# Patient Record
Sex: Male | Born: 1990 | Race: Black or African American | Hispanic: No | Marital: Single | Smoking: Current every day smoker
Health system: Southern US, Community
[De-identification: ages and names within clinical notes are randomized; demographics above are authoritative.]

---
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
## 2010-07-24 ENCOUNTER — Emergency Department: Payer: Self-pay | Admitting: Emergency Medicine

## 2012-01-06 ENCOUNTER — Emergency Department: Payer: Self-pay | Admitting: Emergency Medicine

## 2012-01-11 ENCOUNTER — Emergency Department: Payer: Self-pay | Admitting: Emergency Medicine

## 2012-07-24 ENCOUNTER — Emergency Department: Payer: Self-pay | Admitting: Emergency Medicine

## 2013-10-02 ENCOUNTER — Emergency Department (HOSPITAL_COMMUNITY)
Admission: EM | Admit: 2013-10-02 | Discharge: 2013-10-02 | Disposition: A | Discharge status: Home / Self Care | Payer: Commercial Managed Care - PPO | Attending: Emergency Medicine | Admitting: Emergency Medicine

## 2013-10-02 ENCOUNTER — Encounter (HOSPITAL_COMMUNITY): Payer: Self-pay | Admitting: Emergency Medicine

## 2013-10-02 ENCOUNTER — Emergency Department (HOSPITAL_COMMUNITY): Payer: Commercial Managed Care - PPO

## 2013-10-02 DIAGNOSIS — S8981XA Other specified injuries of right lower leg, initial encounter: Secondary | ICD-10-CM | POA: Insufficient documentation

## 2013-10-02 DIAGNOSIS — Y9367 Activity, basketball: Secondary | ICD-10-CM | POA: Diagnosis not present

## 2013-10-02 DIAGNOSIS — X58XXXA Exposure to other specified factors, initial encounter: Secondary | ICD-10-CM | POA: Insufficient documentation

## 2013-10-02 DIAGNOSIS — Z720 Tobacco use: Secondary | ICD-10-CM | POA: Insufficient documentation

## 2013-10-02 DIAGNOSIS — Y9289 Other specified places as the place of occurrence of the external cause: Secondary | ICD-10-CM | POA: Diagnosis not present

## 2013-10-02 DIAGNOSIS — M25561 Pain in right knee: Secondary | ICD-10-CM

## 2013-10-02 MED ORDER — NAPROXEN 500 MG PO TABS: 500.0000 mg | ORAL_TABLET | Freq: Two times a day (BID) | ORAL | Status: DC

## 2013-10-02 NOTE — ED Notes (Signed)
Patient transported to X-ray 

## 2013-10-02 NOTE — ED Provider Notes (Signed)
CSN: [id number]     Arrival date & time 10/02/13  0008 History   First MD Initiated Contact with Patient 10/02/13 0030     Chief Complaint  Patient presents with  . Knee Injury     (Consider location/radiation/quality/duration/timing/severity/associated sxs/prior Treatment) HPI Sean Clay is a 23 y.o. male with no significant past medical history who comes in for evaluation of right knee pain. Patient states this evening he was playing basketball and went up to jump and as he tried to jump he felt his right knee "give out". He says the bottom of his leg went one direction and the top of his leg went under the direction. He denies any player contact with any one else. Rest makes the pain better and palpation and certain movements exacerbate the pain. He is not taking any medications for the pain. He denies any fevers, numbness or weakness, nausea vomiting  History reviewed. No pertinent past medical history. History reviewed. No pertinent past surgical history. No family history on file. History  Substance Use Topics  . Smoking status: Current Every Day Smoker -- 0.50 packs/day    Types: Cigarettes  . Smokeless tobacco: Not on file  . Alcohol Use: Yes    Review of Systems  Musculoskeletal: Positive for arthralgias.  All other systems reviewed and are negative.     Allergies  Review of patient's allergies indicates no known allergies.  Home Medications   Prior to Admission medications   Not on File   BP 116/63  Pulse 79  Temp(Src) 99 F (37.2 C) (Oral)  Resp 20  SpO2 95% Physical Exam  Nursing note and vitals reviewed. Constitutional: He is oriented to person, place, and time. He appears well-developed and well-nourished.  Awake, alert, nontoxic appearance.  HENT:  Head: Normocephalic and atraumatic.  Mouth/Throat: Oropharynx is clear and moist.  Eyes: Conjunctivae are normal. Pupils are equal, round, and reactive to light. Right eye exhibits no discharge. Left eye  exhibits no discharge. No scleral icterus.  Neck: Neck supple. No JVD present.  Cardiovascular: Intact distal pulses.   Pulmonary/Chest: Effort normal. No respiratory distress.  Abdominal: Soft. He exhibits no distension.  Musculoskeletal:  Tenderness to palpation diffusely of the right knee. Active range of motion is limited due to discomfort. Patient has full passive range of motion. There does not appear to be any ligamentous laxity. No overt swelling or warmth. No erythema. No other obvious lesions or deformities appreciated. Full active range of motion of right hip and right ankle.  Neurological: He is alert and oriented to person, place, and time.  Cranial Nerves II-XII grossly intact. Motor and sensation intact and equal bilaterally. Neurovascularly intact  Skin: Skin is warm and dry. No rash noted.  Psychiatric: He has a normal mood and affect.    ED Course  Procedures (including critical care time) Labs Review Labs Reviewed - No data to display  Imaging Review Dg Knee Complete 4 Views Right  10/02/2013   CLINICAL DATA:  Acute injury: Twisted RIGHT knee while playing basketball. Unable to bear weight.  EXAM: RIGHT KNEE - COMPLETE 4+ VIEW  COMPARISON:  None.  FINDINGS: There is no evidence of fracture, dislocation, or joint effusion. There is no evidence of arthropathy or other focal bone abnormality. Moderate suprapatellar joint effusion.  IMPRESSION: Moderate suprapatellar joint effusion without fracture deformity or dislocation.   Electronically Signed   By: Awilda Metroourtnay  Bloomer   On: 10/02/2013 01:20     EKG Interpretation None  MDM  Vitals stable - WNL -afebrile Pt resting comfortably in ED. playing on his phone and watching TV in no apparent distress. PE not concerning for other acute or emergent pathology. No evidence of hemarthrosis or septic joint. No compartment syndrome. No vascular compromise. Knee pain most likely due to strain. Cannot rule out meniscal tear,  will have patient followup with orthopedics.  Imaging shows no evidence of fracture, dislocation or other osseous abnormalities.  Will DC with knee immobilizer at patient's request, naproxen for inflammation and pain management. We'll have patient followup with Dr. Roda Shutters for orthopedic referral for further evaluation and management of his right knee pain. Discussed f/u with PCP and return precautions, pt very amenable to plan. Patient stable, in good condition and is appropriate for discharge.   Final diagnoses:  Right knee pain        Sharlene Motts, PA-C 10/02/13 0159

## 2013-10-02 NOTE — ED Notes (Signed)
Patient here with complaint of right knee injury. States that he was playing basketball, jumped, and his knee gave out. States that his knee felt like it went in 2 different directions. Range of motion intact, mild swelling noted to lateral aspect of right knee, anterior medial aspect is tender to palpation. Patient states that standing and walking is very painful. Explains that he has had injuries to that knee before, but never able to get it checked until now.

## 2013-10-02 NOTE — ED Provider Notes (Signed)
Medical screening examination/treatment/procedure(s) were performed by non-physician practitioner and as supervising physician I was immediately available for consultation/collaboration.     Devonn Giampietro, MD 10/02/13 0547 

## 2013-10-02 NOTE — Discharge Instructions (Signed)

## 2013-10-07 ENCOUNTER — Other Ambulatory Visit: Payer: Self-pay | Admitting: Orthopedic Surgery

## 2013-10-07 DIAGNOSIS — M25561 Pain in right knee: Secondary | ICD-10-CM

## 2013-10-09 ENCOUNTER — Ambulatory Visit
Admission: RE | Admit: 2013-10-09 | Discharge: 2013-10-09 | Disposition: A | Discharge status: Home / Self Care | Payer: 59 | Source: Ambulatory Visit | Attending: Orthopedic Surgery | Admitting: Orthopedic Surgery

## 2013-10-09 DIAGNOSIS — M25561 Pain in right knee: Secondary | ICD-10-CM

## 2014-01-24 ENCOUNTER — Emergency Department: Payer: Self-pay | Admitting: Internal Medicine

## 2015-06-20 ENCOUNTER — Encounter: Payer: Self-pay | Admitting: Emergency Medicine

## 2015-06-20 ENCOUNTER — Emergency Department
Admission: EM | Admit: 2015-06-20 | Discharge: 2015-06-20 | Disposition: A | Discharge status: Home / Self Care | Payer: 59 | Attending: Emergency Medicine | Admitting: Emergency Medicine

## 2015-06-20 DIAGNOSIS — R05 Cough: Secondary | ICD-10-CM | POA: Diagnosis present

## 2015-06-20 DIAGNOSIS — F1290 Cannabis use, unspecified, uncomplicated: Secondary | ICD-10-CM | POA: Insufficient documentation

## 2015-06-20 DIAGNOSIS — J069 Acute upper respiratory infection, unspecified: Secondary | ICD-10-CM | POA: Diagnosis not present

## 2015-06-20 DIAGNOSIS — F17210 Nicotine dependence, cigarettes, uncomplicated: Secondary | ICD-10-CM | POA: Diagnosis not present

## 2015-06-20 DIAGNOSIS — B9789 Other viral agents as the cause of diseases classified elsewhere: Secondary | ICD-10-CM

## 2015-06-20 DIAGNOSIS — J988 Other specified respiratory disorders: Secondary | ICD-10-CM

## 2015-06-20 MED ORDER — PSEUDOEPH-BROMPHEN-DM 30-2-10 MG/5ML PO SYRP: 5.0000 mL | ORAL_SOLUTION | Freq: Three times a day (TID) | ORAL | Status: DC | PRN

## 2015-06-20 NOTE — Discharge Instructions (Signed)
Viral Infections °A viral infection can be caused by different types of viruses. Most viral infections are not serious and resolve on their own. However, some infections may cause severe symptoms and may lead to further complications. °SYMPTOMS °Viruses can frequently cause: °· Minor sore throat. °· Aches and pains. °· Headaches. °· Runny nose. °· Different types of rashes. °· Watery eyes. °· Tiredness. °· Cough. °· Loss of appetite. °· Gastrointestinal infections, resulting in nausea, vomiting, and diarrhea. °These symptoms do not respond to antibiotics because the infection is not caused by bacteria. However, you might catch a bacterial infection following the viral infection. This is sometimes called a "superinfection." Symptoms of such a bacterial infection may include: °· Worsening sore throat with pus and difficulty swallowing. °· Swollen neck glands. °· Chills and a high or persistent fever. °· Severe headache. °· Tenderness over the sinuses. °· Persistent overall ill feeling (malaise), muscle aches, and tiredness (fatigue). °· Persistent cough. °· Yellow, green, or brown mucus production with coughing. °HOME CARE INSTRUCTIONS  °· Only take over-the-counter or prescription medicines for pain, discomfort, diarrhea, or fever as directed by your caregiver. °· Drink enough water and fluids to keep your urine clear or pale yellow. Sports drinks can provide valuable electrolytes, sugars, and hydration. °· Get plenty of rest and maintain proper nutrition. Soups and broths with crackers or rice are fine. °SEEK IMMEDIATE MEDICAL CARE IF:  °· You have severe headaches, shortness of breath, chest pain, neck pain, or an unusual rash. °· You have uncontrolled vomiting, diarrhea, or you are unable to keep down fluids. °· You or your child has an oral temperature above 102° F (38.9° C), not controlled by medicine. °· Your baby is older than 3 months with a rectal temperature of 102° F (38.9° C) or higher. °· Your baby is 3  months old or younger with a rectal temperature of 100.4° F (38° C) or higher. °MAKE SURE YOU:  °· Understand these instructions. °· Will watch your condition. °· Will get help right away if you are not doing well or get worse. °  °This information is not intended to replace advice given to you by your health care provider. Make sure you discuss any questions you have with your health care provider. °  °Document Released: 09/27/2004 Document Revised: 03/12/2011 Document Reviewed: 05/26/2014 °Elsevier Interactive Patient Education ©2016 [employer] ° °

## 2015-06-20 NOTE — ED Notes (Signed)
Pt presents to ED with c/o cough and congestion since yesterday. Denies fever.

## 2015-06-20 NOTE — ED Provider Notes (Signed)
CSN: [id number]     Arrival date & time 06/20/15  1917 History   First MD Initiated Contact with Patient 06/20/15 2025     Chief Complaint  Patient presents with  . Cough  . Nasal Congestion     (Consider location/radiation/quality/duration/timing/severity/associated sxs/prior Treatment) HPI  25 year old male presents to emergency department for 2 day history of cough, chest and nasal congestion, sore throat, headache/sinus pressure. Symptoms been present for 2 days. No fevers. No nausea vomiting or diarrhea. Denies any chest pain shortness of breath or abdominal pain. Denies any skin rashes or neck pain. He uses nasal decongestant spray one day ago, and did see significant relief has not used today. Denies taking any other medications over-the-counter.  History reviewed. No pertinent past medical history. History reviewed. No pertinent past surgical history. No family history on file. Social History  Substance Use Topics  . Smoking status: Current Every Day Smoker -- 0.50 packs/day    Types: Cigarettes  . Smokeless tobacco: None  . Alcohol Use: Yes    Review of Systems  Constitutional: Positive for chills. Negative for fever, activity change and appetite change.  HENT: Positive for congestion. Negative for ear pain, mouth sores, rhinorrhea, sinus pressure, sore throat and trouble swallowing.   Eyes: Negative for photophobia, pain and discharge.  Respiratory: Positive for cough (dry nonproductive). Negative for chest tightness and shortness of breath.   Cardiovascular: Negative for chest pain and leg swelling.  Gastrointestinal: Negative for nausea, vomiting, abdominal pain, diarrhea and abdominal distention.  Genitourinary: Negative for dysuria and difficulty urinating.  Musculoskeletal: Negative for back pain, arthralgias and gait problem.  Skin: Negative for color change and rash.  Neurological: Negative for dizziness and headaches.  Hematological: Negative for adenopathy.   Psychiatric/Behavioral: Negative for behavioral problems and agitation.      Allergies  Review of patient's allergies indicates no known allergies.  Home Medications   Prior to Admission medications   Medication Sig Start Date End Date Taking? Authorizing Provider  brompheniramine-pseudoephedrine-DM 30-2-10 MG/5ML syrup Take 5 mLs by mouth 3 (three) times daily as needed. 06/20/15   Evon Slack, PA-C  naproxen (NAPROSYN) 500 MG tablet Take 1 tablet (500 mg total) by mouth 2 (two) times daily. 10/02/13   Joycie Peek, PA-C   BP 127/77 mmHg  Pulse 93  Temp(Src) 98.4 F (36.9 C) (Oral)  Resp 20  Ht 6' (1.829 m)  Wt 79.379 kg  BMI 23.73 kg/m2  SpO2 98% Physical Exam  Constitutional: He is oriented to person, place, and time. He appears well-developed and well-nourished.  HENT:  Head: Normocephalic and atraumatic.  Right Ear: External ear normal.  Left Ear: External ear normal.  Nose: Nose normal.  Mouth/Throat: Oropharynx is clear and moist. No oropharyngeal exudate.  No pharyngeal swelling, exudates, uvular shifting. No signs of peritonsillar abscess. Patient with mild nasal congestion. No tenderness to percussion over the frontal or maxillary sinuses.  Eyes: Conjunctivae and EOM are normal. Pupils are equal, round, and reactive to light.  Neck: Normal range of motion. Neck supple.  Cardiovascular: Normal rate, regular rhythm, normal heart sounds and intact distal pulses.   Pulmonary/Chest: Effort normal and breath sounds normal. No respiratory distress. He has no wheezes. He has no rales. He exhibits no tenderness.  Abdominal: Soft. Bowel sounds are normal. He exhibits no distension. There is no tenderness.  Musculoskeletal: Normal range of motion. He exhibits no edema or tenderness.  Lymphadenopathy:    He has cervical adenopathy (posterior cervical).  Neurological:  He is alert and oriented to person, place, and time.  Skin: Skin is warm and dry.  Psychiatric: He has  a normal mood and affect. His behavior is normal. Judgment and thought content normal.    ED Course  Procedures (including critical care time) Labs Review Labs Reviewed - No data to display  Imaging Review No results found. I have personally reviewed and evaluated these images and lab results as part of my medical decision-making.   EKG Interpretation None      MDM   Final diagnoses:  Viral respiratory infection  25 year old male with viral upper respiratory symptoms for 2 days. Vital signs are normal, exam unremarkable except for mild rhinorrhea, posterior cervical lymphadenopathy. Patient is started Bromfed-DM. He will also take ibuprofen over-the-counter as needed. Educated on signs and symptoms to return to the ED for.    Evon Slackhomas C Gaines, PA-C 06/20/15 2046  Maurilio LovelyNoelle McLaurin, MD 06/20/15 [phone]

## 2015-06-20 NOTE — ED Notes (Signed)
Pt c/o coughing, chest/nasal congestion with nasal drainage and HA x2 days. Pt denies any OTC meds.

## 2015-11-18 ENCOUNTER — Emergency Department
Admission: EM | Admit: 2015-11-18 | Discharge: 2015-11-18 | Disposition: A | Discharge status: Home / Self Care | Payer: Self-pay | Attending: Emergency Medicine | Admitting: Emergency Medicine

## 2015-11-18 ENCOUNTER — Encounter: Payer: Self-pay | Admitting: Emergency Medicine

## 2015-11-18 ENCOUNTER — Emergency Department: Payer: Self-pay

## 2015-11-18 DIAGNOSIS — A549 Gonococcal infection, unspecified: Secondary | ICD-10-CM | POA: Insufficient documentation

## 2015-11-18 DIAGNOSIS — F17210 Nicotine dependence, cigarettes, uncomplicated: Secondary | ICD-10-CM | POA: Insufficient documentation

## 2015-11-18 LAB — URINALYSIS COMPLETE WITH MICROSCOPIC (ARMC ONLY)
BILIRUBIN URINE: NEGATIVE
Bacteria, UA: NONE SEEN
GLUCOSE, UA: NEGATIVE mg/dL
KETONES UR: NEGATIVE mg/dL
NITRITE: NEGATIVE
Protein, ur: 30 mg/dL — AB
SPECIFIC GRAVITY, URINE: 1.02 (ref 1.005–1.030)
Squamous Epithelial / LPF: NONE SEEN
pH: 7 (ref 5.0–8.0)

## 2015-11-18 LAB — CHLAMYDIA/NGC RT PCR (ARMC ONLY)
CHLAMYDIA TR: NOT DETECTED
N GONORRHOEAE: DETECTED — AB

## 2015-11-18 MED ORDER — CEFTRIAXONE SODIUM 250 MG IJ SOLR
INTRAMUSCULAR | Status: AC
  Administered 2015-11-18: 250 mg via INTRAMUSCULAR
  Filled 2015-11-18: qty 250

## 2015-11-18 MED ORDER — CEFTRIAXONE SODIUM 250 MG IJ SOLR
250.0000 mg | Freq: Once | INTRAMUSCULAR | Status: AC
  Administered 2015-11-18: 250 mg via INTRAMUSCULAR
  Filled 2015-11-18: qty 250

## 2015-11-18 NOTE — ED Provider Notes (Signed)
  Physical Exam  BP (!) 141/79 (BP Location: Right Arm)   Pulse 97   Temp 98.4 F (36.9 C) (Oral)   Resp 18   Ht 6' (1.829 m)   Wt 76.7 kg   SpO2 98%   BMI 22.92 kg/m   Physical Exam  ED Course  Procedures  MDM ----------------------------------------- 4:37 PM on 11/18/2015 -----------------------------------------  Care was transferred to me by Durward Parcelon Smith, PA-C who completed initial assessment, physical exam. Gonorrhea and chlamydia culture has returned positive for gonorrhea. Order to treat the patient with 250 mg of IM Rocephin has been placed. Patient has been notified of all laboratory results and agrees to treatment with Rocephin. Currently we are awaiting CT results.  ----------------------------------------- 5:22 PM on 11/18/2015 -----------------------------------------  CT renal stone study returned without any acute abnormalities. Anticipate symptoms that patient was previously experiencing were due to the gonorrhea infection. He was treated with IM injection of Rocephin 250 mg tolerated well without side effects. Patient will [facility] with [facility] in one week for repeat testing. He is advised to abstain from all sexual intercourse until repeat testing for gonorrhea returns negative. He is also advised that all of his recent sexual partner should be evaluated and treated for STI in the outpatient urgent care for their choice or the [place] health Department. Patient verbalized understanding of all instructions.       Hope PigeonJami L Swati Granberry, PA-C 11/18/15 1724    Nita Sicklearolina Veronese, MD 11/18/15 2001

## 2015-11-18 NOTE — ED Notes (Signed)
Pt is in CT

## 2015-11-18 NOTE — ED Provider Notes (Signed)
[facility] Emergency Department Provider Note   ____________________________________________   First MD Initiated Contact with Patient 11/18/15 1411     (approximate)  I have reviewed the triage vital signs and the nursing notes.   HISTORY  Chief Complaint Knot in groin and Dysuria    HPI Sean Clay is a 25 y.o. male patient complaining of dysuria and a whitest discharge. Patient stated onset was right flank pain which began 3 days ago. Patient also complaining of knot in the right groin area. Patient stated onset of this complaint was yesterday. Patient denies any fever or chills. Patient state last unprotected sexual contact was a week ago. No palliative measures for this complaint.  History reviewed. No pertinent past medical history.  There are no active problems to display for this patient.   No past surgical history on file.  Prior to Admission medications   Medication Sig Start Date End Date Taking? Authorizing Provider  brompheniramine-pseudoephedrine-DM 30-2-10 MG/5ML syrup Take 5 mLs by mouth 3 (three) times daily as needed. 06/20/15   Evon Slackhomas C Gaines, PA-C  naproxen (NAPROSYN) 500 MG tablet Take 1 tablet (500 mg total) by mouth 2 (two) times daily. 10/02/13   Joycie PeekBenjamin Cartner, PA-C    Allergies Patient has no known allergies.  No family history on file.  Social History Social History  Substance Use Topics  . Smoking status: Current Every Day Smoker    Packs/day: 0.50    Types: Cigarettes  . Smokeless tobacco: Never Used  . Alcohol use Yes    Review of Systems Constitutional: No fever/chills Eyes: No visual changes. ENT: No sore throat. Cardiovascular: Denies chest pain. Respiratory: Denies shortness of breath. Gastrointestinal: No abdominal pain.  No nausea, no vomiting.  No diarrhea.  No constipation. Genitourinary: Positive for dysuria. Musculoskeletal: Negative for back pain. Skin: Negative for rash. Neurological:  Negative for headaches, focal weakness or numbness.    ____________________________________________   PHYSICAL EXAM:  VITAL SIGNS: ED Triage Vitals  Enc Vitals Group     BP 11/18/15 1358 (!) 141/79     Pulse Rate 11/18/15 1358 97     Resp 11/18/15 1358 18     Temp 11/18/15 1358 98.4 F (36.9 C)     Temp Source 11/18/15 1358 Oral     SpO2 11/18/15 1358 98 %     Weight 11/18/15 1359 169 lb (76.7 kg)     Height 11/18/15 1359 6' (1.829 m)     Head Circumference --      Peak Flow --      Pain Score --      Pain Loc --      Pain Edu? --      Excl. in GC? --     Constitutional: Alert and oriented. Well appearing and in no acute distress. Eyes: Conjunctivae are normal. PERRL. EOMI. Head: Atraumatic. Nose: No congestion/rhinnorhea. Mouth/Throat: Mucous membranes are moist.  Oropharynx non-erythematous. Neck: No stridor.  No cervical spine tenderness to palpation. Hematological/Lymphatic/Immunilogical:Right inguinal lymphadenopathy. Cardiovascular: Normal rate, regular rhythm. Grossly normal heart sounds.  Good peripheral circulation. Respiratory: Normal respiratory effort.  No retractions. Lungs CTAB. Gastrointestinal: Soft and nontender. No distention. No abdominal bruits. No CVA tenderness. Musculoskeletal: No lower extremity tenderness nor edema.  No joint effusions. Neurologic:  Normal speech and language. No gross focal neurologic deficits are appreciated. No gait instability. Skin:  Skin is warm, dry and intact. No rash noted. Psychiatric: Mood and affect are normal. Speech and behavior are normal.  ____________________________________________   LABS (all labs ordered are listed, but only abnormal results are displayed)  Labs Reviewed  CHLAMYDIA/NGC RT PCR ([facility] ONLY)  URINALYSIS COMPLETEWITH MICROSCOPIC ([facility] ONLY)    ____________________________________________  EKG   ____________________________________________  RADIOLOGY   ____________________________________________   PROCEDURES  Procedure(s) performed: None  Procedures  Critical Care performed: No  ____________________________________________   INITIAL IMPRESSION / ASSESSMENT AND PLAN / ED COURSE  Pertinent labs & imaging results that were available during my care of the patient were reviewed by me and considered in my medical decision making (see chart for details).   Care assumed by PA Hagler 1609 hrs.  Clinical Course      ____________________________________________   FINAL CLINICAL IMPRESSION(S) / ED DIAGNOSES  Final diagnoses:  None      NEW MEDICATIONS STARTED DURING THIS VISIT:  New Prescriptions   No medications on file     Note:  This document was prepared using Dragon voice recognition software and may include unintentional dictation errors.    Joni Reiningonald K Smith, PA-C 11/18/15 1610    Sharyn CreamerMark Quale, MD 11/20/15 [phone]

## 2015-11-18 NOTE — Discharge Instructions (Signed)
Abstain from all intercourse for 1 week and recheck with the health department.   All recent sexual partners should seek medical treatment in an outpatient urgent care or the health department to be tested and treated.

## 2015-11-18 NOTE — ED Notes (Signed)
See triage note  States he noticed a small knot to groin area this am  And some pain with urination  Also noted some white discharge in underwear  Denies any other sx's

## 2015-11-18 NOTE — ED Triage Notes (Signed)
Pt reports a knot felt in his groin, painful urination and dried white discharge in his underwear. Pt starts first noticed these things yesterday. Pt denies pain, denies nausea, vomiting or diarrhea.

## 2015-11-21 ENCOUNTER — Telehealth: Payer: Self-pay | Admitting: Emergency Medicine

## 2015-11-21 NOTE — Telephone Encounter (Signed)
Called patient to tell him he needs additional medication to treat the gonorrhea.  Per dr Cyril Loosenkinner can call in azithromycin 1 gram po.  Patient understands.  Says his current partner has been treated and former partner has been notified.  Azithromycin called in to [facility] [address].

## 2016-06-16 ENCOUNTER — Emergency Department
Admission: EM | Admit: 2016-06-16 | Discharge: 2016-06-16 | Disposition: A | Discharge status: Home / Self Care | Payer: 59 | Attending: Emergency Medicine | Admitting: Emergency Medicine

## 2016-06-16 ENCOUNTER — Encounter: Payer: Self-pay | Admitting: Emergency Medicine

## 2016-06-16 DIAGNOSIS — F17210 Nicotine dependence, cigarettes, uncomplicated: Secondary | ICD-10-CM | POA: Insufficient documentation

## 2016-06-16 DIAGNOSIS — R0981 Nasal congestion: Secondary | ICD-10-CM | POA: Insufficient documentation

## 2016-06-16 DIAGNOSIS — J00 Acute nasopharyngitis [common cold]: Secondary | ICD-10-CM | POA: Insufficient documentation

## 2016-06-16 LAB — POCT RAPID STREP A: Streptococcus, Group A Screen (Direct): NEGATIVE

## 2016-06-16 MED ORDER — AZITHROMYCIN 250 MG PO TABS: ORAL_TABLET | ORAL | 0 refills | Status: AC

## 2016-06-16 NOTE — ED Triage Notes (Signed)
Pt states that he has a sore throat which started this morning. Pt states that it has gotten progressively worse throughout the day. Pt is ambulatory to triage with NAD noted at this time.

## 2016-06-16 NOTE — ED Provider Notes (Signed)
[facility] Emergency Department Provider Note   ____________________________________________   I have reviewed the triage vital signs and the nursing notes.   HISTORY  Chief Complaint Sore Throat    HPI Sean Clay is a 26 y.o. male presents with sore throat, congestion and rhinorrhea that began 1 day ago. Patient denies any other symptoms, sick contacts or recent associated illnesses. Patient denies trying any OTC medications prior to coming to the Emergency department. Patient denies fever, chills, headache, vision changes, chest pain, chest tightness, shortness of breath, abdominal pain, nausea and vomiting.  History reviewed. No pertinent past medical history.  There are no active problems to display for this patient.   History reviewed. No pertinent surgical history.  Prior to Admission medications   Medication Sig Start Date End Date Taking? Authorizing Provider  azithromycin (ZITHROMAX Z-PAK) 250 MG tablet Take 2 tablets (500 mg) on  Day 1,  followed by 1 tablet (250 mg) once daily on Days 2 through 5. 06/16/16 06/21/16  Jamerion Cabello M, PA-C    Allergies Patient has no known allergies.  No family history on file.  Social History Social History  Substance Use Topics  . Smoking status: Current Every Day Smoker    Packs/day: 0.50    Types: Cigarettes  . Smokeless tobacco: Never Used  . Alcohol use Yes    Review of Systems Constitutional: Negative for fever/chills Eyes: No visual changes. ENT:  Positive for sore throat and congestion. Cardiovascular: Denies chest pain. Respiratory: Denies cough Denies shortness of breath. Gastrointestinal: No abdominal pain.  Musculoskeletal: Negative for back pain. Negative for generalized body aches. Skin: Negative for rash. Neurological: Negative for headaches.  ____________________________________________   PHYSICAL EXAM:  VITAL SIGNS: ED Triage Vitals [06/16/16 1904]  Enc Vitals Group     BP 131/72     Pulse Rate 79     Resp 18     Temp 98.2 F (36.8 C)     Temp Source Oral     SpO2 98 %     Weight 185 lb (83.9 kg)     Height 6\' 1"  (1.854 m)     Head Circumference      Peak Flow      Pain Score 6     Pain Loc      Pain Edu?      Excl. in GC?     Constitutional: Alert and oriented. Well appearing and in no acute distress.  Head: Normocephalic and atraumatic. Eyes: Conjunctivae are normal. PERRL. Ears: Canals clear. TMs intact bilaterally. Nose: Congestion and rhinorrhea present Mouth/Throat: Mucous membranes are moist. Oropharynx clear and normal. Tonsils bilaterally symmetrical.  Neck: Supple.  Hematological/Lymphatic/Immunological: No cervical lymphadenopathy. Cardiovascular: Normal rate, regular rhythm. Normal distal pulses. Respiratory: Normal respiratory effort. No wheezes/rales/rhonchi. Lungs CTAB. Gastrointestinal: Soft and nontender. Musculoskeletal: Nontender with normal range of motion in all extremities. Neurologic: Normal speech and language. Skin:  Skin is warm, dry and intact. No rash noted. Psychiatric: Mood and affect are normal.  ____________________________________________   LABS (all labs ordered are listed, but only abnormal results are displayed)  Labs Reviewed  POCT RAPID STREP A  Negative ____________________________________________  EKG none ____________________________________________  RADIOLOGY none ____________________________________________   PROCEDURES  Procedure(s) performed: no    Critical Care performed: no ____________________________________________   INITIAL IMPRESSION / ASSESSMENT AND PLAN / ED COURSE  Pertinent labs & imaging results that were available during my care of the patient were reviewed by me and considered in my  medical decision making (see chart for details).  Patient presents to the emergency department sore throat, congestion, rhinorrhea however afebrile. Symptoms are consistent with  nasopharyngitis. Patient will be given prescription for Azithromycin and advised to take over the counter cough and cold medication for symptoms relief. Patient will also be encourage to rest, and rehydrate as a part of supportive care. Physical exam is reassuring at this time. Patient / Family  informed of clinical course, understand medical decision-making process, and agree with plan.  Patient was advised to follow up with PCP as needed and was also advised to return to the emergency department for symptoms that change or worsen.      ____________________________________________   FINAL CLINICAL IMPRESSION(S) / ED DIAGNOSES  Final diagnoses:  Nasopharyngitis acute       NEW MEDICATIONS STARTED DURING THIS VISIT:  Discharge Medication List as of 06/16/2016  8:12 PM    START taking these medications   Details  azithromycin (ZITHROMAX Z-PAK) 250 MG tablet Take 2 tablets (500 mg) on  Day 1,  followed by 1 tablet (250 mg) once daily on Days 2 through 5., Print         Note:  This document was prepared using Dragon voice recognition software and may include unintentional dictation errors.   Cesare Sumlin, Jordan Likesraci M, PA-C 06/17/16 0000    Nita SickleVeronese, [place], MD 06/17/16 [phone]

## 2016-07-09 ENCOUNTER — Encounter: Payer: Self-pay | Admitting: *Deleted

## 2016-07-09 DIAGNOSIS — Z5321 Procedure and treatment not carried out due to patient leaving prior to being seen by health care provider: Secondary | ICD-10-CM | POA: Insufficient documentation

## 2016-07-09 DIAGNOSIS — R42 Dizziness and giddiness: Secondary | ICD-10-CM | POA: Insufficient documentation

## 2016-07-09 LAB — CBC
HCT: 46.3 % (ref 40.0–52.0)
Hemoglobin: 15.5 g/dL (ref 13.0–18.0)
MCH: 28.9 pg (ref 26.0–34.0)
MCHC: 33.6 g/dL (ref 32.0–36.0)
MCV: 86.2 fL (ref 80.0–100.0)
PLATELETS: 176 10*3/uL (ref 150–440)
RBC: 5.37 MIL/uL (ref 4.40–5.90)
RDW: 14.4 % (ref 11.5–14.5)
WBC: 9.3 10*3/uL (ref 3.8–10.6)

## 2016-07-09 LAB — BASIC METABOLIC PANEL
Anion gap: 6 (ref 5–15)
BUN: 13 mg/dL (ref 6–20)
CALCIUM: 9.5 mg/dL (ref 8.9–10.3)
CO2: 24 mmol/L (ref 22–32)
CREATININE: 1.1 mg/dL (ref 0.61–1.24)
Chloride: 107 mmol/L (ref 101–111)
GFR calc Af Amer: >60 mL/min (ref >60)
GFR calc non Af Amer: >60 mL/min (ref >60)
Glucose, Bld: 93 mg/dL (ref 65–99)
Potassium: 4.2 mmol/L (ref 3.5–5.1)
SODIUM: 137 mmol/L (ref 135–145)

## 2016-07-09 NOTE — ED Triage Notes (Signed)
Pt reports eating dinner at his moms house than feeling dizzy today, pt denies pain or any other symptoms

## 2016-07-10 ENCOUNTER — Emergency Department
Admission: EM | Admit: 2016-07-10 | Discharge: 2016-07-10 | Disposition: A | Discharge status: Home / Self Care | Payer: 59 | Attending: Emergency Medicine | Admitting: Emergency Medicine

## 2016-08-02 DIAGNOSIS — M25561 Pain in right knee: Secondary | ICD-10-CM | POA: Insufficient documentation

## 2016-08-02 DIAGNOSIS — F17210 Nicotine dependence, cigarettes, uncomplicated: Secondary | ICD-10-CM | POA: Insufficient documentation

## 2016-08-03 ENCOUNTER — Emergency Department
Admission: EM | Admit: 2016-08-03 | Discharge: 2016-08-03 | Disposition: A | Discharge status: Home / Self Care | Payer: 59 | Attending: Emergency Medicine | Admitting: Emergency Medicine

## 2016-08-03 ENCOUNTER — Emergency Department: Payer: 59

## 2016-08-03 ENCOUNTER — Encounter: Payer: Self-pay | Admitting: Emergency Medicine

## 2016-08-03 DIAGNOSIS — M25561 Pain in right knee: Secondary | ICD-10-CM

## 2016-08-03 MED ORDER — OXYCODONE-ACETAMINOPHEN 5-325 MG PO TABS
ORAL_TABLET | ORAL | Status: AC
  Administered 2016-08-03: 1 via ORAL
  Filled 2016-08-03: qty 1

## 2016-08-03 MED ORDER — OXYCODONE-ACETAMINOPHEN 5-325 MG PO TABS
1.0000 | ORAL_TABLET | Freq: Once | ORAL | Status: AC
  Administered 2016-08-03: 1 via ORAL

## 2016-08-03 MED ORDER — OXYCODONE-ACETAMINOPHEN 5-325 MG PO TABS: 1.0000 | ORAL_TABLET | ORAL | 0 refills | Status: DC | PRN

## 2016-08-03 NOTE — ED Provider Notes (Signed)
[facility] Emergency Department Provider Note    First MD Initiated Contact with Patient 08/03/16 0125     (approximate)  I have reviewed the triage vital signs and the nursing notes.   HISTORY  Chief Complaint Knee Pain    HPI Laymond PurserDecory Deleon is a 26 y.o. male with history of right knee injury (MRI 2015 revealed medial lateral meniscus tears and complete anterior cruciate ligament tear). Patient states that he never underwent repair or any surgical intervention for his knee injury from 2015. Patient presents today with progressive right knee pain 3 days. She states this current pain score is 8 out of 10 worse with any movement of the right knee.   Past medical history Right knee medial lateral meniscus tears Right knee complete anterior cruciate ligament tear There are no active problems to display for this patient.  Past surgical history None  Prior to Admission medications   Medication Sig Start Date End Date Taking? Authorizing Provider  oxyCODONE-acetaminophen (ROXICET) 5-325 MG tablet Take 1 tablet by mouth every 4 (four) hours as needed for severe pain. 08/03/16   Darci CurrentBrown, [place] N, MD    Allergies No known drug allergies  No family history on file.  Social History Social History  Substance Use Topics  . Smoking status: Current Every Day Smoker    Packs/day: 0.50    Types: Cigarettes  . Smokeless tobacco: Never Used  . Alcohol use Yes    Review of Systems Constitutional: No fever/chills Eyes: No visual changes. ENT: No sore throat. Cardiovascular: Denies chest pain. Respiratory: Denies shortness of breath. Gastrointestinal: No abdominal pain.  No nausea, no vomiting.  No diarrhea.  No constipation. Genitourinary: Negative for dysuria. Musculoskeletal: Negative for neck pain.  Negative for back pain. Integumentary: Negative for rash. Neurological: Negative for headaches, focal weakness or  numbness.  ____________________________________________   PHYSICAL EXAM:  VITAL SIGNS: ED Triage Vitals  Enc Vitals Group     BP 08/03/16 0004 138/77     Pulse Rate 08/03/16 0004 74     Resp --      Temp 08/03/16 0004 98 F (36.7 C)     Temp Source 08/03/16 0004 Oral     SpO2 08/03/16 0004 100 %     Weight 08/03/16 0002 83 kg (183 lb)     Height 08/03/16 0002 1.854 m (6\' 1" )     Head Circumference --      Peak Flow --      Pain Score 08/03/16 0002 7     Pain Loc --      Pain Edu? --      Excl. in GC? --     Constitutional: Alert and oriented. Well appearing and in no acute distress. Eyes: Conjunctivae are normal.  Head: Atraumatic. Mouth/Throat: Mucous membranes are moist.  Oropharynx non-erythematous. Neck: No stridor.  Cardiovascular: Normal rate, regular rhythm. Good peripheral circulation. Grossly normal heart sounds. Respiratory: Normal respiratory effort.  No retractions. Lungs CTAB. Gastrointestinal: Soft and nontender. No distention.  Musculoskeletal: Anterior right knee pain with palpation. Pain with anterior and posterior drawer test. Pain with valgus and varus stress test  Neurologic:  Normal speech and language. No gross focal neurologic deficits are appreciated.  Skin:  Skin is warm, dry and intact. No rash noted. Psychiatric: Mood and affect are normal. Speech and behavior are normal.    RADIOLOGY I, Weakley N BROWN, personally viewed and evaluated these images (plain radiographs) as part of my medical decision making,  as well as reviewing the written report by the radiologist.  Dg Knee Complete 4 Views Right  Result Date: 08/03/2016 CLINICAL DATA:  Initial evaluation for acute right knee pain and swelling for 2 days. EXAM: RIGHT KNEE - COMPLETE 4+ VIEW COMPARISON:  Prior MRI from 10/09/2013 FINDINGS: No acute fracture or dislocation. Moderate joint effusion present within the suprapatellar recess. Mild to moderate degenerative osteoarthritic changes,  greatest within the medial femorotibial joint space compartment. Osseous mineralization normal. No soft tissue abnormality. IMPRESSION: 1. No acute osseous abnormality about the right knee. 2. Moderate joint effusion. 3. 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 Electronically Signed   By: Rise MuBenjamin  McClintock M.D.   On: 08/03/2016 00:23    Procedures   ____________________________________________   INITIAL IMPRESSION / ASSESSMENT AND PLAN / ED COURSE  Pertinent labs & imaging results that were available during my care of the patient were reviewed by me and considered in my medical decision making (see chart for details).   26 year old male presenting the emergency department with right knee pain that is nontraumatic in nature at this time. Patient however had a history of medial lateral meniscus injury as well as complete anterior cruciate ligament tear that was never surgically intervened on. Knee immobilizer applied patient given crutches and analgesia. Patient referred to orthopedic surgery for further outpatient evaluation and management.     ____________________________________________  FINAL CLINICAL IMPRESSION(S) / ED DIAGNOSES  Final diagnoses:  Acute pain of right knee     MEDICATIONS GIVEN DURING THIS VISIT:  Medications  oxyCODONE-acetaminophen (PERCOCET/ROXICET) 5-325 MG per tablet 1 tablet (not administered)     NEW OUTPATIENT MEDICATIONS STARTED DURING THIS VISIT:  New Prescriptions   OXYCODONE-ACETAMINOPHEN (ROXICET) 5-325 MG TABLET    Take 1 tablet by mouth every 4 (four) hours as needed for severe pain.    Modified Medications   No medications on file    Discontinued Medications   No medications on file     Note:  This document was prepared using Dragon voice recognition software and may include unintentional dictation errors.    Darci CurrentBrown, [place] N, MD 08/03/16 [phone]

## 2016-08-03 NOTE — ED Notes (Signed)
Pt demonstrated use if crutches and applied immobilizer. Pt used crutches to leave ED and was in NAD. Pt has prescription and verbalized understanding of follow up.

## 2016-08-03 NOTE — ED Notes (Signed)
Pt reports having a hx of trauma to right knee. Hx of torn meniscus and MRI in the past. PT denies new injury to right knee today. Swelling, pain and warmth noted to right knee with decreased movement and pain when wight is applied to knee. No decreased sensation. No swelling in leg. Pt reports pain feels "like when you have dry socket after having a tooth pulled."

## 2016-08-03 NOTE — ED Triage Notes (Signed)
Patient ambulatory to triage with steady gait, without difficulty or distress noted; pt reports right knee pain/swelling; st injured it a year ago playing basketball and was told he tore the meniscus

## 2016-11-29 ENCOUNTER — Emergency Department
Admission: EM | Admit: 2016-11-29 | Discharge: 2016-11-29 | Disposition: A | Discharge status: Home / Self Care | Payer: 59 | Attending: Emergency Medicine | Admitting: Emergency Medicine

## 2016-11-29 ENCOUNTER — Other Ambulatory Visit: Payer: Self-pay

## 2016-11-29 ENCOUNTER — Encounter: Payer: Self-pay | Admitting: *Deleted

## 2016-11-29 DIAGNOSIS — K4090 Unilateral inguinal hernia, without obstruction or gangrene, not specified as recurrent: Secondary | ICD-10-CM | POA: Insufficient documentation

## 2016-11-29 DIAGNOSIS — F17210 Nicotine dependence, cigarettes, uncomplicated: Secondary | ICD-10-CM | POA: Insufficient documentation

## 2016-11-29 DIAGNOSIS — Z79899 Other long term (current) drug therapy: Secondary | ICD-10-CM | POA: Insufficient documentation

## 2016-11-29 NOTE — ED Provider Notes (Signed)
[facility] Emergency Department Provider Note  ____________________________________________   First MD Initiated Contact with Patient 11/29/16 0300     (approximate)  I have reviewed the triage vital signs and the nursing notes.   HISTORY  Chief Complaint Groin Swelling    HPI Sean Clay is a 26 y.o. male with no chronic medical history and who is otherwise healthy who presents by private vehicle for evaluation of some swelling in his left groin for about 3 days.  He states that it is not painful but it is tender to the touch.  Straining such as lifting heavy objects or straining to have a bowel movement makes the symptoms worse.  Resting and lying flat make it better.  He has had no swelling in his scrotum and no testicular pain or tenderness and no penile pain nor discharge.  He is not having any difficulty urinating or having bowel movements.  He denies fever/chills, chest pain, shortness of breath, nausea, vomiting, and abdominal pain.  He describes the symptoms as mild to moderate and the pain is an aching pain but occasionally sharp if he pushes on the area.  No past medical history on file.  There are no active problems to display for this patient.   No past surgical history on file.  Prior to Admission medications   Medication Sig Start Date End Date Taking? Authorizing Provider  oxyCODONE-acetaminophen (ROXICET) 5-325 MG tablet Take 1 tablet by mouth every 4 (four) hours as needed for severe pain. 08/03/16   Darci CurrentBrown, [place] N, MD    Allergies Patient has no known allergies.  No family history on file.  Social History Social History   Tobacco Use  . Smoking status: Current Every Day Smoker    Packs/day: 0.50    Types: Cigarettes  . Smokeless tobacco: Never Used  Substance Use Topics  . Alcohol use: Yes  . Drug use: Yes    Types: Marijuana    Review of Systems Constitutional: No fever/chills Cardiovascular: Denies chest  pain. Respiratory: Denies shortness of breath. Gastrointestinal: No abdominal pain.  No nausea, no vomiting.  No diarrhea.  No constipation.  Genitourinary: Left groin swelling with tenderness to palpation.  No testicular pain, penile pain, nor scrotal swelling. Musculoskeletal: Negative for neck pain.  Negative for back pain. Neurological: Negative for headaches, focal weakness or numbness.   ____________________________________________   PHYSICAL EXAM:  VITAL SIGNS: ED Triage Vitals  Enc Vitals Group     BP 11/29/16 0110 138/79     Pulse Rate 11/29/16 0110 90     Resp 11/29/16 0110 18     Temp 11/29/16 0110 97.7 F (36.5 C)     Temp Source 11/29/16 0110 Oral     SpO2 11/29/16 0110 99 %     Weight 11/29/16 0110 79.8 kg (176 lb)     Height 11/29/16 0110 1.829 m (6')     Head Circumference --      Peak Flow --      Pain Score 11/29/16 0115 8     Pain Loc --      Pain Edu? --      Excl. in GC? --     Constitutional: Alert and oriented. Well appearing and in no acute distress. Eyes: Conjunctivae are normal.  Cardiovascular: Normal rate, regular rhythm. Good peripheral circulation.  Respiratory: Normal respiratory effort.  No retractions.  Gastrointestinal: Soft and nontender. No distention.  Genitourinary: Normal circumcised male external genital exam.  No testicular tenderness,  no penile tenderness, no lesions, no scrotal edema.  The patient has a small area of a palpable soft structure in the region of the left inguinal canal.  It does not change or move when the patient bears down or coughs.  It is roughly a tubular structure several centimeters long.  It is mildly tender but not firm nor indurated. Musculoskeletal: No lower extremity tenderness nor edema. No gross deformities of extremities. Neurologic:  Normal speech and language. No gross focal neurologic deficits are appreciated.  Skin:  Skin is warm, dry and intact. No rash noted. Psychiatric: Mood and affect are  normal. Speech and behavior are normal.  ____________________________________________   LABS (all labs ordered are listed, but only abnormal results are displayed)  Labs Reviewed - No data to display ____________________________________________  EKG  None - EKG not ordered by ED physician ____________________________________________  RADIOLOGY   No results found.  ____________________________________________   PROCEDURES  Critical Care performed: No   Procedure(s) performed:   Procedures   ____________________________________________   INITIAL IMPRESSION / ASSESSMENT AND PLAN / ED COURSE  As part of my medical decision making, I reviewed the following data within the electronic MEDICAL RECORD NUMBER Nursing notes reviewed and incorporated  Differential diagnosis includes, but is not limited to, acute appendicitis, renal colic, testicular torsion, urinary tract infection/pyelonephritis, prostatitis,  epididymitis, diverticulitis, small bowel obstruction or ileus, colitis, abdominal aortic aneurysm, gastroenteritis, hernia, etc. however the patient's physical exam is very reassuring and his vital signs are normal.  He has no genital symptoms at all and there is no indication for a scrotal ultrasound.  I believe he has a very small, nonincarcerated, non-strangulated inguinal hernia on the left side, although it is possible he may have a cyst or some other similar structure.  Regardless it is nonemergent at this time and does not require an immediate surgical consult.  I gave my usual and customary hernia management recommendations including picking up an inguinal truss, being careful with straining and lifting, and following up with surgery.  I gave my usual and customary return precautions.  The patient understands and agrees with the plan.       ____________________________________________  FINAL CLINICAL IMPRESSION(S) / ED DIAGNOSES  Final diagnoses:  Left inguinal  hernia     MEDICATIONS GIVEN DURING THIS VISIT:  Medications - No data to display   ED Discharge Orders    None       Note:  This document was prepared using Dragon voice recognition software and may include unintentional dictation errors.    Loleta RoseForbach, Aurelia Gras, MD 11/29/16 [phone]

## 2016-11-29 NOTE — ED Triage Notes (Addendum)
Pt has swelling to left groin.  Sx for 3 days.  Area tender to touch.  No diff urinating.  No abd pain. 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  No back pain.  Pt alert.

## 2016-11-29 NOTE — Discharge Instructions (Signed)
As we discussed, we believe your pain is the result of a small inguinal hernia.  Please read through the included discharge instructions.  Although you do not need urgent or emergent surgery, it is very important that you follow-up with a surgeon as indicated in these documents to discuss definitive treatment of this condition with a surgical procedure.  Please apply pressure to the affected area with your hand when you are getting ready to cough, sneeze, get out of bed, or otherwise strain.  This may help prevent the hernia from "popping out".  He should also look at your local pharmacy to see if they have an "inguinal truss" or other holster-like device that applies constant pressure to the area.  Take regular over-the-counter pain medicine as indicated on the labels and any prescriptions provided today.  Follow-up as indicated in these documents.  Return to the emergency department if he develop new or worsening pain or if you are concerned that the hernia has come out and is stuck, resulting in nausea and vomiting, inability to have bowel movement or passed gas, fever or chills, or other symptoms that concern you.

## 2017-12-09 ENCOUNTER — Encounter: Payer: Self-pay | Admitting: Emergency Medicine

## 2017-12-09 ENCOUNTER — Emergency Department: Payer: Self-pay

## 2017-12-09 ENCOUNTER — Other Ambulatory Visit: Payer: Self-pay

## 2017-12-09 ENCOUNTER — Emergency Department
Admission: EM | Admit: 2017-12-09 | Discharge: 2017-12-09 | Disposition: A | Discharge status: Home / Self Care | Payer: Self-pay | Attending: Student in an Organized Health Care Education/Training Program | Admitting: Student in an Organized Health Care Education/Training Program

## 2017-12-09 DIAGNOSIS — Y939 Activity, unspecified: Secondary | ICD-10-CM | POA: Insufficient documentation

## 2017-12-09 DIAGNOSIS — L03113 Cellulitis of right upper limb: Secondary | ICD-10-CM | POA: Insufficient documentation

## 2017-12-09 DIAGNOSIS — F17210 Nicotine dependence, cigarettes, uncomplicated: Secondary | ICD-10-CM | POA: Insufficient documentation

## 2017-12-09 DIAGNOSIS — W57XXXA Bitten or stung by nonvenomous insect and other nonvenomous arthropods, initial encounter: Secondary | ICD-10-CM | POA: Insufficient documentation

## 2017-12-09 DIAGNOSIS — Y929 Unspecified place or not applicable: Secondary | ICD-10-CM | POA: Insufficient documentation

## 2017-12-09 DIAGNOSIS — L0390 Cellulitis, unspecified: Secondary | ICD-10-CM

## 2017-12-09 DIAGNOSIS — Y999 Unspecified external cause status: Secondary | ICD-10-CM | POA: Insufficient documentation

## 2017-12-09 DIAGNOSIS — S60561A Insect bite (nonvenomous) of right hand, initial encounter: Secondary | ICD-10-CM | POA: Insufficient documentation

## 2017-12-09 MED ORDER — DEXAMETHASONE SODIUM PHOSPHATE 10 MG/ML IJ SOLN
10.0000 mg | Freq: Once | INTRAMUSCULAR | Status: AC
  Administered 2017-12-09: 10 mg via INTRAMUSCULAR
  Filled 2017-12-09: qty 1

## 2017-12-09 MED ORDER — CEPHALEXIN 500 MG PO CAPS: 500.0000 mg | ORAL_CAPSULE | Freq: Three times a day (TID) | ORAL | 0 refills | Status: DC

## 2017-12-09 MED ORDER — PREDNISONE 10 MG (21) PO TBPK: ORAL_TABLET | ORAL | 0 refills | Status: DC

## 2017-12-09 NOTE — ED Notes (Signed)
Pt reports that this am the top of his right hand started itching and then he noticed that it was swollen - no definitive bite observed - pt states that he had to leave work because his hand started swelling - he took benadryl at 8am but states that this made him feel worse Pt has bug bite to right lower back  Pt reports left middle toe is itching and red (refuses to remove shoe at this time)

## 2017-12-09 NOTE — ED Triage Notes (Signed)
FIRST NURSE NOTE-mild swelling right hand. Pt thinks bit by spider. Ambulatory, NAD

## 2017-12-09 NOTE — ED Provider Notes (Signed)
[facility] Emergency Department Provider Note  ____________________________________________   First MD Initiated Contact with Patient 12/09/17 1231     (approximate)  I have reviewed the triage vital signs and the nursing notes.   HISTORY  Chief Complaint Insect Bite    HPI Sean Clay is a 27 y.o. male reports emergency department complaining of swelling to the right hand has itching.  He states that he had another area on his foot that did the same.  He thinks it is bug bites that are creating this issue.  He denies fever or chills.  He states he took Benadryl and did decrease a little of the swelling.  He denies any recent IVs or IV drug use.    History reviewed. No pertinent past medical history.  There are no active problems to display for this patient.   History reviewed. No pertinent surgical history.  Prior to Admission medications   Medication Sig Start Date End Date Taking? Authorizing Provider  cephALEXin (KEFLEX) 500 MG capsule Take 1 capsule (500 mg total) by mouth 3 (three) times daily. 12/09/17   Starleen Trussell, Roselyn Bering, PA-C  predniSONE (STERAPRED UNI-PAK 21 TAB) 10 MG (21) TBPK tablet Take 6 pills on day one then decrease by 1 pill each day 12/09/17   Faythe Ghee, PA-C    Allergies Patient has no known allergies.  No family history on file.  Social History Social History   Tobacco Use  . Smoking status: Current Every Day Smoker    Packs/day: 0.50    Types: Cigarettes  . Smokeless tobacco: Never Used  Substance Use Topics  . Alcohol use: Yes  . Drug use: Yes    Types: Marijuana    Review of Systems  Constitutional: No fever/chills Eyes: No visual changes. ENT: No sore throat. Respiratory: Denies cough Genitourinary: Negative for dysuria. Musculoskeletal: Negative for back pain.  Positive for right hand swelling Skin: Negative for rash.    ____________________________________________   PHYSICAL EXAM:  VITAL  SIGNS: ED Triage Vitals [12/09/17 1159]  Enc Vitals Group     BP 125/66     Pulse Rate 70     Resp 18     Temp 98.4 F (36.9 C)     Temp Source Oral     SpO2 99 %     Weight 179 lb (81.2 kg)     Height 6' (1.829 m)     Head Circumference      Peak Flow      Pain Score 0     Pain Loc      Pain Edu?      Excl. in GC?     Constitutional: Alert and oriented. Well appearing and in no acute distress. Eyes: Conjunctivae are normal.  Head: Atraumatic. Nose: No congestion/rhinnorhea. Mouth/Throat: Mucous membranes are moist.   Neck:  supple no lymphadenopathy noted Cardiovascular: Normal rate, regular rhythm. Heart sounds are normal Respiratory: Normal respiratory effort.  No retractions, lungs c t a  GU: deferred Musculoskeletal: FROM all extremities, warm and well perfused, the right hand is swollen and reddened and large portion of the hand, there is no bony tenderness noted, no streaks of redness going up the arm. Neurologic:  Normal speech and language.  Skin:  Skin is warm, dry and intact.  Psychiatric: Mood and affect are normal. Speech and behavior are normal.  ____________________________________________   LABS (all labs ordered are listed, but only abnormal results are displayed)  Labs Reviewed - No  data to display ____________________________________________   ____________________________________________  RADIOLOGY  X-ray of the right hand is negative for any abnormality  ____________________________________________   PROCEDURES  Procedure(s) performed: Decadron 10 mg IM  Procedures    ____________________________________________   INITIAL IMPRESSION / ASSESSMENT AND PLAN / ED COURSE  Pertinent labs & imaging results that were available during my care of the patient were reviewed by me and considered in my medical decision making (see chart for details).   Patient is 27 year old male presents emergency department complaining of redness and swelling  to the right hand.  He thinks it is from a bug bite.  Physical exam shows a reddened swollen right hand.  X-ray of the right hand is negative for gas pattern or fracture  Explained findings to the patient.  He was given an injection of Decadron.  He was given a prescription for Sterapred.  He is to take the antibiotic prescription if the redness is increasing.  He states he understands will comply.  He was discharged in stable condition.     As part of my medical decision making, I reviewed the following data within the electronic MEDICAL RECORD NUMBER Nursing notes reviewed and incorporated, Old chart reviewed, Radiograph reviewed x-ray of the right hand is negative, Notes from prior ED visits and [place] Controlled Substance Database  ____________________________________________   FINAL CLINICAL IMPRESSION(S) / ED DIAGNOSES  Final diagnoses:  Bug bite, initial encounter  Cellulitis, unspecified cellulitis site      NEW MEDICATIONS STARTED DURING THIS VISIT:  Discharge Medication List as of 12/09/2017  1:45 PM    START taking these medications   Details  cephALEXin (KEFLEX) 500 MG capsule Take 1 capsule (500 mg total) by mouth 3 (three) times daily., Starting Mon 12/09/2017, Normal    predniSONE (STERAPRED UNI-PAK 21 TAB) 10 MG (21) TBPK tablet Take 6 pills on day one then decrease by 1 pill each day, Normal         Note:  This document was prepared using Dragon voice recognition software and may include unintentional dictation errors.    Faythe GheeFisher, Liel Rudden W, PA-C 12/09/17 1747    Willy Eddyobinson, Patrick, MD 12/10/17 [phone]

## 2017-12-09 NOTE — Discharge Instructions (Addendum)
Follow-up with your regular doctor if not better in 3 days.  Return emergency department if worsening.  Take the medication as prescribed.  Start the steroids tomorrow.  Antibiotic if the redness is increasing.

## 2017-12-09 NOTE — ED Triage Notes (Signed)
Possible insect bites. Noted yesterday R hand swollen and red and itchy, similar spot back and foot.

## 2018-02-23 ENCOUNTER — Other Ambulatory Visit: Payer: Self-pay

## 2018-02-23 ENCOUNTER — Emergency Department
Admission: EM | Admit: 2018-02-23 | Discharge: 2018-02-23 | Disposition: A | Discharge status: Home / Self Care | Payer: Self-pay | Attending: Emergency Medicine | Admitting: Emergency Medicine

## 2018-02-23 ENCOUNTER — Emergency Department: Payer: Self-pay

## 2018-02-23 DIAGNOSIS — X58XXXA Exposure to other specified factors, initial encounter: Secondary | ICD-10-CM | POA: Insufficient documentation

## 2018-02-23 DIAGNOSIS — Y9367 Activity, basketball: Secondary | ICD-10-CM | POA: Insufficient documentation

## 2018-02-23 DIAGNOSIS — S93422A Sprain of deltoid ligament of left ankle, initial encounter: Secondary | ICD-10-CM | POA: Insufficient documentation

## 2018-02-23 DIAGNOSIS — F17210 Nicotine dependence, cigarettes, uncomplicated: Secondary | ICD-10-CM | POA: Insufficient documentation

## 2018-02-23 DIAGNOSIS — Y999 Unspecified external cause status: Secondary | ICD-10-CM | POA: Insufficient documentation

## 2018-02-23 DIAGNOSIS — Y929 Unspecified place or not applicable: Secondary | ICD-10-CM | POA: Insufficient documentation

## 2018-02-23 MED ORDER — MELOXICAM 15 MG PO TABS: 15.0000 mg | ORAL_TABLET | Freq: Every day | ORAL | 1 refills | Status: AC

## 2018-02-23 NOTE — ED Triage Notes (Signed)
Patient to ED after rolling his left ankle while playing basketball. Left ankle is swollen and painful. Unable to bear weight on it at all.

## 2018-02-23 NOTE — ED Provider Notes (Signed)
[facility] Emergency Department Provider Note  ____________________________________________  Time seen: Approximately 10:52 PM  I have reviewed the triage vital signs and the nursing notes.   HISTORY  Chief Complaint Ankle Pain    HPI Sean Clay is a 28 y.o. male presents to the emergency department with acute left medial ankle pain after patient reports that he sustained an inversion type ankle injury while playing basketball.  Patient reports the injury occurred 1 day ago.  Patient reports that he thought his left ankle swelling and pain would improve but it has not.  He denies numbness or tingling in the left lower extremity.  Patient has been using ice and ibuprofen at home.  No prior left ankle injuries in the past.     History reviewed. No pertinent past medical history.  There are no active problems to display for this patient.   History reviewed. No pertinent surgical history.  Prior to Admission medications   Medication Sig Start Date End Date Taking? Authorizing Provider  cephALEXin (KEFLEX) 500 MG capsule Take 1 capsule (500 mg total) by mouth 3 (three) times daily. 12/09/17   Fisher, Roselyn Bering, PA-C  meloxicam (MOBIC) 15 MG tablet Take 1 tablet (15 mg total) by mouth daily for 7 days. 02/23/18 03/02/18  Orvil Feil, PA-C  predniSONE (STERAPRED UNI-PAK 21 TAB) 10 MG (21) TBPK tablet Take 6 pills on day one then decrease by 1 pill each day 12/09/17   Faythe Ghee, PA-C    Allergies Patient has no known allergies.  No family history on file.  Social History Social History   Tobacco Use  . Smoking status: Current Every Day Smoker    Packs/day: 0.50    Types: Cigarettes  . Smokeless tobacco: Never Used  Substance Use Topics  . Alcohol use: Yes  . Drug use: Yes    Types: Marijuana     Review of Systems  Constitutional: No fever/chills Eyes: No visual changes. No discharge ENT: No upper respiratory complaints. Cardiovascular: no  chest pain. Respiratory: no cough. No SOB. Gastrointestinal: No abdominal pain.  No nausea, no vomiting.  No diarrhea.  No constipation. Musculoskeletal: Patient has left ankle pain.  Skin: Negative for rash, abrasions, lacerations, ecchymosis. Neurological: Negative for headaches, focal weakness or numbness.  ____________________________________________   PHYSICAL EXAM:  VITAL SIGNS: ED Triage Vitals  Enc Vitals Group     BP 02/23/18 1928 130/75     Pulse Rate 02/23/18 1928 73     Resp 02/23/18 1928 18     Temp 02/23/18 1928 98.1 F (36.7 C)     Temp Source 02/23/18 1928 Oral     SpO2 02/23/18 1928 99 %     Weight 02/23/18 1930 178 lb (80.7 kg)     Height 02/23/18 1930 6' (1.829 m)     Head Circumference --      Peak Flow --      Pain Score 02/23/18 1930 7     Pain Loc --      Pain Edu? --      Excl. in GC? --      Constitutional: Alert and oriented. Well appearing and in no acute distress. Eyes: Conjunctivae are normal. PERRL. EOMI. Head: Atraumatic. Cardiovascular: Normal rate, regular rhythm. Normal S1 and S2.  Good peripheral circulation. Respiratory: Normal respiratory effort without tachypnea or retractions. Lungs CTAB. Good air entry to the bases with no decreased or absent breath sounds. Musculoskeletal: Patient has gross swelling over the deltoid  ligament with surrounding ecchymosis.  Patient is unable to perform full range of motion at the left ankle, likely secondary to pain.  No significant swelling over the anterior posterior talofibular ligaments.  He is able to move all 5 left toes.  Palpable dorsalis pedis pulse bilaterally and symmetrically. Neurologic:  Normal speech and language. No gross focal neurologic deficits are appreciated.  Skin:  Skin is warm, dry and intact. No rash noted. Psychiatric: Mood and affect are normal. Speech and behavior are normal. Patient exhibits appropriate insight and judgement.   ____________________________________________    LABS (all labs ordered are listed, but only abnormal results are displayed)  Labs Reviewed - No data to display ____________________________________________  EKG   ____________________________________________  RADIOLOGY I personally viewed and evaluated these images as part of my medical decision making, as well as reviewing the written report by the radiologist.  Dg Ankle Complete Left  Result Date: 02/23/2018 CLINICAL DATA:  Injury during basketball, rolled left ankle. EXAM: LEFT ANKLE COMPLETE - 3+ VIEW COMPARISON:  None. FINDINGS: There is no evidence of fracture, dislocation, or joint effusion. There is no evidence of arthropathy or other focal bone abnormality. Soft tissues are unremarkable. IMPRESSION: Negative. Electronically Signed   By: Charlett Nose M.D.   On: 02/23/2018 20:19    ____________________________________________    PROCEDURES  Procedure(s) performed:    Procedures    Medications - No data to display   ____________________________________________   INITIAL IMPRESSION / ASSESSMENT AND PLAN / ED COURSE  Pertinent labs & imaging results that were available during my care of the patient were reviewed by me and considered in my medical decision making (see chart for details).  Review of the [place] CSRS was performed in accordance of the [facility] prior to dispensing any controlled drugs.      Assessment and plan Left ankle sprain Patient presents to the emergency department after an inversion type ankle injury while playing basketball.  Patient had significant pain and swelling over the deltoid ligament.  An Ace wrap was applied in the emergency department and ice, compression elevation recommended.  No acute bony abnormalities on x-ray examination of the left ankle.  Patient was advised to follow-up with podiatry and referral was given.  All patient questions were answered.     ____________________________________________  FINAL CLINICAL IMPRESSION(S)  / ED DIAGNOSES  Final diagnoses:  Sprain of deltoid ligament of left ankle, initial encounter      NEW MEDICATIONS STARTED DURING THIS VISIT:  ED Discharge Orders         Ordered    meloxicam (MOBIC) 15 MG tablet  Daily     02/23/18 2142              This chart was dictated using voice recognition software/Dragon. Despite best efforts to proofread, errors can occur which can change the meaning. Any change was purely unintentional.    Orvil Feil, PA-C 02/23/18 2302    Minna Antis, MD 02/23/18 [phone]

## 2018-02-26 ENCOUNTER — Other Ambulatory Visit: Payer: Self-pay

## 2018-02-26 ENCOUNTER — Emergency Department: Payer: Self-pay

## 2018-02-26 ENCOUNTER — Encounter: Payer: Self-pay | Admitting: Emergency Medicine

## 2018-02-26 ENCOUNTER — Emergency Department
Admission: EM | Admit: 2018-02-26 | Discharge: 2018-02-26 | Disposition: A | Discharge status: Home / Self Care | Payer: Self-pay | Attending: Emergency Medicine | Admitting: Emergency Medicine

## 2018-02-26 DIAGNOSIS — S93402D Sprain of unspecified ligament of left ankle, subsequent encounter: Secondary | ICD-10-CM | POA: Insufficient documentation

## 2018-02-26 DIAGNOSIS — S93402A Sprain of unspecified ligament of left ankle, initial encounter: Secondary | ICD-10-CM

## 2018-02-26 DIAGNOSIS — X58XXXD Exposure to other specified factors, subsequent encounter: Secondary | ICD-10-CM | POA: Insufficient documentation

## 2018-02-26 DIAGNOSIS — F17210 Nicotine dependence, cigarettes, uncomplicated: Secondary | ICD-10-CM | POA: Insufficient documentation

## 2018-02-26 NOTE — ED Provider Notes (Signed)
[facility] Emergency Department Provider Note  ____________________________________________   First MD Initiated Contact with Patient 02/26/18 2045     (approximate)  I have reviewed the triage vital signs and the nursing notes.   HISTORY  Chief Complaint Ankle Pain    HPI Sean Clay is a 28 y.o. male presents emergency department complaining of left ankle pain.  He was injured on Saturday and seen here on Sunday.  He states x-ray was negative but now the bruising and swelling have gotten much worse.  He is having increased pain with weightbearing.    History reviewed. No pertinent past medical history.  There are no active problems to display for this patient.   History reviewed. No pertinent surgical history.  Prior to Admission medications   Medication Sig Start Date End Date Taking? Authorizing Provider  meloxicam (MOBIC) 15 MG tablet Take 1 tablet (15 mg total) by mouth daily for 7 days. 02/23/18 03/02/18  Orvil Feil, PA-C    Allergies Patient has no known allergies.  No family history on file.  Social History Social History   Tobacco Use  . Smoking status: Current Every Day Smoker    Packs/day: 0.50    Types: Cigarettes  . Smokeless tobacco: Never Used  Substance Use Topics  . Alcohol use: Yes  . Drug use: Yes    Types: Marijuana    Review of Systems  Constitutional: No fever/chills Eyes: No visual changes. ENT: No sore throat. Respiratory: Denies cough Genitourinary: Negative for dysuria. Musculoskeletal: Negative for back pain.  Positive for left ankle pain Skin: Negative for rash.    ____________________________________________   PHYSICAL EXAM:  VITAL SIGNS: ED Triage Vitals  Enc Vitals Group     BP 02/26/18 2029 126/68     Pulse Rate 02/26/18 2029 87     Resp 02/26/18 2029 18     Temp 02/26/18 2029 98.2 F (36.8 C)     Temp Source 02/26/18 2029 Oral     SpO2 02/26/18 2029 98 %     Weight 02/26/18  2029 182 lb (82.6 kg)     Height 02/26/18 2029 6' (1.829 m)     Head Circumference --      Peak Flow --      Pain Score 02/26/18 2028 7     Pain Loc --      Pain Edu? --      Excl. in GC? --     Constitutional: Alert and oriented. Well appearing and in no acute distress. Eyes: Conjunctivae are normal.  Head: Atraumatic. Nose: No congestion/rhinnorhea. Mouth/Throat: Mucous membranes are moist.   Neck:  supple no lymphadenopathy noted Cardiovascular: Normal rate, regular rhythm. Respiratory: Normal respiratory effort.  No retractions GU: deferred Musculoskeletal: Decreased range of motion of the left ankle, there is a large amount of bruising and swelling noted at this time.  Neurovascular is intact.   Neurologic:  Normal speech and language.  Skin:  Skin is warm, dry and intact. No rash noted.  Bruising noted Psychiatric: Mood and affect are normal. Speech and behavior are normal.  ____________________________________________   LABS (all labs ordered are listed, but only abnormal results are displayed)  Labs Reviewed - No data to display ____________________________________________   ____________________________________________  RADIOLOGY  Repeat x-ray of the left ankle is still negative  ____________________________________________   PROCEDURES  Procedure(s) performed: Posterior OCL was applied by the tech      ____________________________________________   INITIAL IMPRESSION / ASSESSMENT AND  PLAN / ED COURSE  Pertinent labs & imaging results that were available during my care of the patient were reviewed by me and considered in my medical decision making (see chart for details).   Patient is 28 year old male presents emergency department complaint continued and increased left ankle pain.  Physical exam shows left ankle to be very bruised and swollen.  Tender at the lateral aspect.  X-ray of the left ankle is negative for fracture  Due to the presentation  of the large amount of swelling and bruising he was placed on posterior OCL.  Patient already has crutches so he is going to use these.  He is to follow-up with orthopedics.  He was given a work note stating that he cannot go to work until he is evaluated by orthopedics.  He is to elevate and ice.  Take meloxicam as prescribed.  He states he understands and will comply.  Was discharged stable condition.     As part of my medical decision making, I reviewed the following data within the electronic MEDICAL RECORD NUMBER History obtained from family, Nursing notes reviewed and incorporated, Old chart reviewed, Radiograph reviewed Left ankle is negative, Notes from prior ED visits and [place] Controlled Substance Database  ____________________________________________   FINAL CLINICAL IMPRESSION(S) / ED DIAGNOSES  Final diagnoses:  Sprain of left ankle, unspecified ligament, initial encounter      NEW MEDICATIONS STARTED DURING THIS VISIT:  Current Discharge Medication List       Note:  This document was prepared using Dragon voice recognition software and may include unintentional dictation errors.    Faythe Ghee, PA-C 02/26/18 2223    Nita Sickle, MD 03/03/18 [phone]

## 2018-02-26 NOTE — ED Notes (Signed)
Patient injuried ankle on Saturday and was seen on Sunday. Bruising is worse now. Patient has good pulses.

## 2018-02-26 NOTE — Discharge Instructions (Signed)
Follow-up with [facility].  Please call for an appointment.  Keep the ankle elevated and iced.  Use crutches.  Wear the splint until seen by orthopedics.

## 2018-02-26 NOTE — ED Triage Notes (Signed)
Patient ambulatory to triage with steady gait, without difficulty or distress noted; pt reports seen for ankle injury on Sunday; 2 days later noted purplish bruising to foot & ankle; has appt with podiatrist tomorrow; ace wrap in place and using crutches

## 2019-05-22 ENCOUNTER — Emergency Department
Admission: EM | Admit: 2019-05-22 | Discharge: 2019-05-22 | Disposition: A | Discharge status: Home / Self Care | Payer: Self-pay | Attending: Emergency Medicine | Admitting: Emergency Medicine

## 2019-05-22 ENCOUNTER — Encounter: Payer: Self-pay | Admitting: Emergency Medicine

## 2019-05-22 ENCOUNTER — Other Ambulatory Visit: Payer: Self-pay

## 2019-05-22 ENCOUNTER — Emergency Department: Payer: Self-pay

## 2019-05-22 DIAGNOSIS — M79605 Pain in left leg: Secondary | ICD-10-CM

## 2019-05-22 DIAGNOSIS — W01198A Fall on same level from slipping, tripping and stumbling with subsequent striking against other object, initial encounter: Secondary | ICD-10-CM | POA: Insufficient documentation

## 2019-05-22 DIAGNOSIS — Y929 Unspecified place or not applicable: Secondary | ICD-10-CM | POA: Insufficient documentation

## 2019-05-22 DIAGNOSIS — Y9389 Activity, other specified: Secondary | ICD-10-CM | POA: Insufficient documentation

## 2019-05-22 DIAGNOSIS — F17210 Nicotine dependence, cigarettes, uncomplicated: Secondary | ICD-10-CM | POA: Insufficient documentation

## 2019-05-22 DIAGNOSIS — M79662 Pain in left lower leg: Secondary | ICD-10-CM | POA: Insufficient documentation

## 2019-05-22 DIAGNOSIS — S80812A Abrasion, left lower leg, initial encounter: Secondary | ICD-10-CM | POA: Insufficient documentation

## 2019-05-22 DIAGNOSIS — Y990 Civilian activity done for income or pay: Secondary | ICD-10-CM | POA: Insufficient documentation

## 2019-05-22 MED ORDER — NAPROXEN 500 MG PO TABS
500.0000 mg | ORAL_TABLET | Freq: Once | ORAL | Status: AC
  Administered 2019-05-22: 500 mg via ORAL
  Filled 2019-05-22: qty 1

## 2019-05-22 MED ORDER — NAPROXEN 500 MG PO TABS: 500.0000 mg | ORAL_TABLET | Freq: Two times a day (BID) | ORAL | Status: AC

## 2019-05-22 NOTE — ED Notes (Signed)
Ex[lained  the wait    blanket and juice offered

## 2019-05-22 NOTE — ED Triage Notes (Signed)
Patient ambulatory to triage with steady gait, without difficulty or distress noted, mask in place; pt reports at work yesterday slipped and hit left lower leg on equipment; denies any abrasions or lacerations; st his leg just bent in an awkward position; denies desire to file workers comp at this time

## 2019-05-22 NOTE — ED Notes (Signed)
See triage note  Presents with pain to left lower leg  States tripped yesterday  Hitting his leg  Ambulates well

## 2019-05-22 NOTE — ED Provider Notes (Signed)
[facility] Emergency Department Provider Note   ____________________________________________   First MD Initiated Contact with Patient 05/22/19 [phone]     (approximate)  I have reviewed the triage vital signs and the nursing notes.   HISTORY  Chief Complaint Leg Pain    HPI Sean Clay is a 29 y.o. male patient complain of left leg pain secondary to contusion at work yesterday.  Patient state pain with ambulation.  Patient rates pain a 7/10.  Patient described pain is "achy".  No palliative measure for complaint.         History reviewed. No pertinent past medical history.  There are no problems to display for this patient.   History reviewed. No pertinent surgical history.  Prior to Admission medications   Medication Sig Start Date End Date Taking? Authorizing Provider  naproxen (NAPROSYN) 500 MG tablet Take 1 tablet (500 mg total) by mouth 2 (two) times daily with a meal. 05/22/19   Sable Feil, PA-C    Allergies Patient has no known allergies.  No family history on file.  Social History Social History   Tobacco Use  . Smoking status: Current Every Day Smoker    Packs/day: 0.50    Types: Cigarettes  . Smokeless tobacco: Never Used  Substance Use Topics  . Alcohol use: Yes  . Drug use: Yes    Types: Marijuana    Review of Systems Constitutional: No fever/chills Eyes: No visual changes. ENT: No sore throat. Cardiovascular: Denies chest pain. Respiratory: Denies shortness of breath. Gastrointestinal: No abdominal pain.  No nausea, no vomiting.  No diarrhea.  No constipation. Genitourinary: Negative for dysuria. Musculoskeletal: Left lower leg pain. Skin: Negative for rash. Neurological: Negative for headaches, focal weakness or numbness.   ____________________________________________   PHYSICAL EXAM:  VITAL SIGNS: ED Triage Vitals  Enc Vitals Group     BP 05/22/19 0640 (!) 130/53     Pulse Rate 05/22/19 0640 79       Resp 05/22/19 0640 18     Temp 05/22/19 0640 98.2 F (36.8 C)     Temp Source 05/22/19 0640 Oral     SpO2 05/22/19 0640 99 %     Weight 05/22/19 0641 170 lb (77.1 kg)     Height 05/22/19 0641 6' (1.829 m)     Head Circumference --      Peak Flow --      Pain Score 05/22/19 0641 7     Pain Loc --      Pain Edu? --      Excl. in [place]? --     Constitutional: Alert and oriented. Well appearing and in no acute distress. Cardiovascular: Normal rate, regular rhythm. Grossly normal heart sounds.  Good peripheral circulation. Respiratory: Normal respiratory effort.  No retractions. Lungs CTAB. Musculoskeletal: Mild guarding with palpation of the distal third of the tib-fib.   Neurologic:  Normal speech and language. No gross focal neurologic deficits are appreciated. No gait instability. Skin:  Skin is warm, dry and intact. No rash noted.  Abrasion anterior distal third of the tib-fib. Psychiatric: Mood and affect are normal. Speech and behavior are normal.  ____________________________________________   LABS (all labs ordered are listed, but only abnormal results are displayed)  Labs Reviewed - No data to display ____________________________________________  EKG   ____________________________________________  RADIOLOGY  ED MD interpretation:    Official radiology report(s): DG Tibia/Fibula Left  Result Date: 05/22/2019 CLINICAL DATA:  Left lower extremity injury. EXAM: LEFT TIBIA  AND FIBULA - 2 VIEW COMPARISON:  Left ankle 02/26/2018. FINDINGS: No acute bony or joint abnormality identified. No evidence of fracture. No focal abnormality identified. Soft tissues are unremarkable. IMPRESSION: No acute or focal abnormality identified. Electronically Signed   By: Maisie Fus  Register   On: 05/22/2019 07:40    ____________________________________________   PROCEDURES  Procedure(s) performed (including Critical  Care):  Procedures   ____________________________________________   INITIAL IMPRESSION / ASSESSMENT AND PLAN / ED COURSE  As part of my medical decision making, I reviewed the following data within the electronic MEDICAL RECORD NUMBER     Patient presents with left leg pain secondary to contusion.  Discussed negative x-ray findings with patient.  Patient given discharge care instructions and advised take medication as directed.  Patient given a work note for today and advised [facility] with [facility].    Sean Clay was evaluated in Emergency Department on 05/22/2019 for the symptoms described in the history of present illness. He was evaluated in the context of the global COVID-19 pandemic, which necessitated consideration that the patient might be at risk for infection with the SARS-CoV-2 virus that causes COVID-19. Institutional protocols and algorithms that pertain to the evaluation of patients at risk for COVID-19 are in a state of rapid change based on information released by regulatory bodies including the CDC and federal and state organizations. These policies and algorithms were followed during the patient's care in the ED.       ____________________________________________   FINAL CLINICAL IMPRESSION(S) / ED DIAGNOSES  Final diagnoses:  Left leg pain     ED Discharge Orders         Ordered    naproxen (NAPROSYN) 500 MG tablet  2 times daily with meals     05/22/19 0942           Note:  This document was prepared using Dragon voice recognition software and may include unintentional dictation errors.    Joni Reining, PA-C 05/22/19 1610    Jene Every, MD 05/22/19 1043

## 2019-07-14 ENCOUNTER — Telehealth: Payer: Self-pay | Admitting: General Practice

## 2019-07-14 NOTE — Telephone Encounter (Signed)
Individual has been contacted regarding ED referral and ended the phone call. No further attempts to contact the individual will be made.

## 2020-06-26 IMAGING — CR DG TIBIA/FIBULA 2V*L*
1 series · 4 of 4 positions shown · non-contrast
Comparison: Left ankle 02/26/2018.

CLINICAL DATA: Left lower extremity injury.

EXAM:
LEFT TIBIA AND FIBULA - 2 VIEW

[Series 1: dg tibia/fibula left · 0.14mm/px · 4 of 4 slices shown]
[im 1/4]
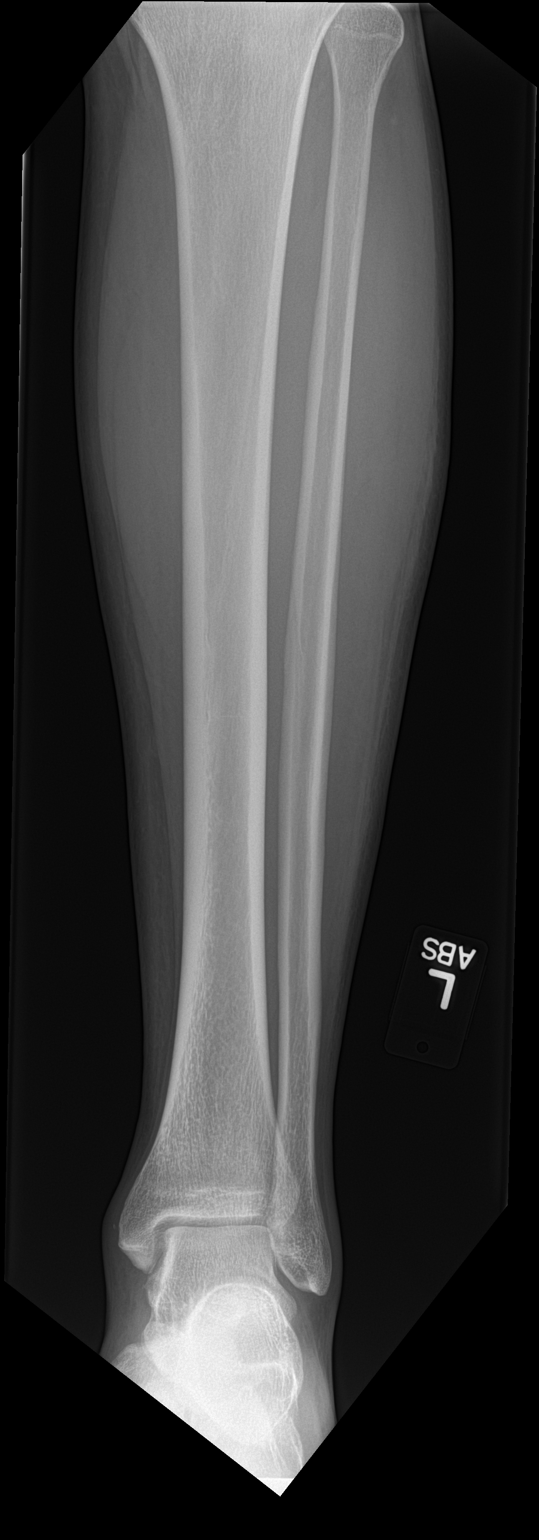
[im 2/4]
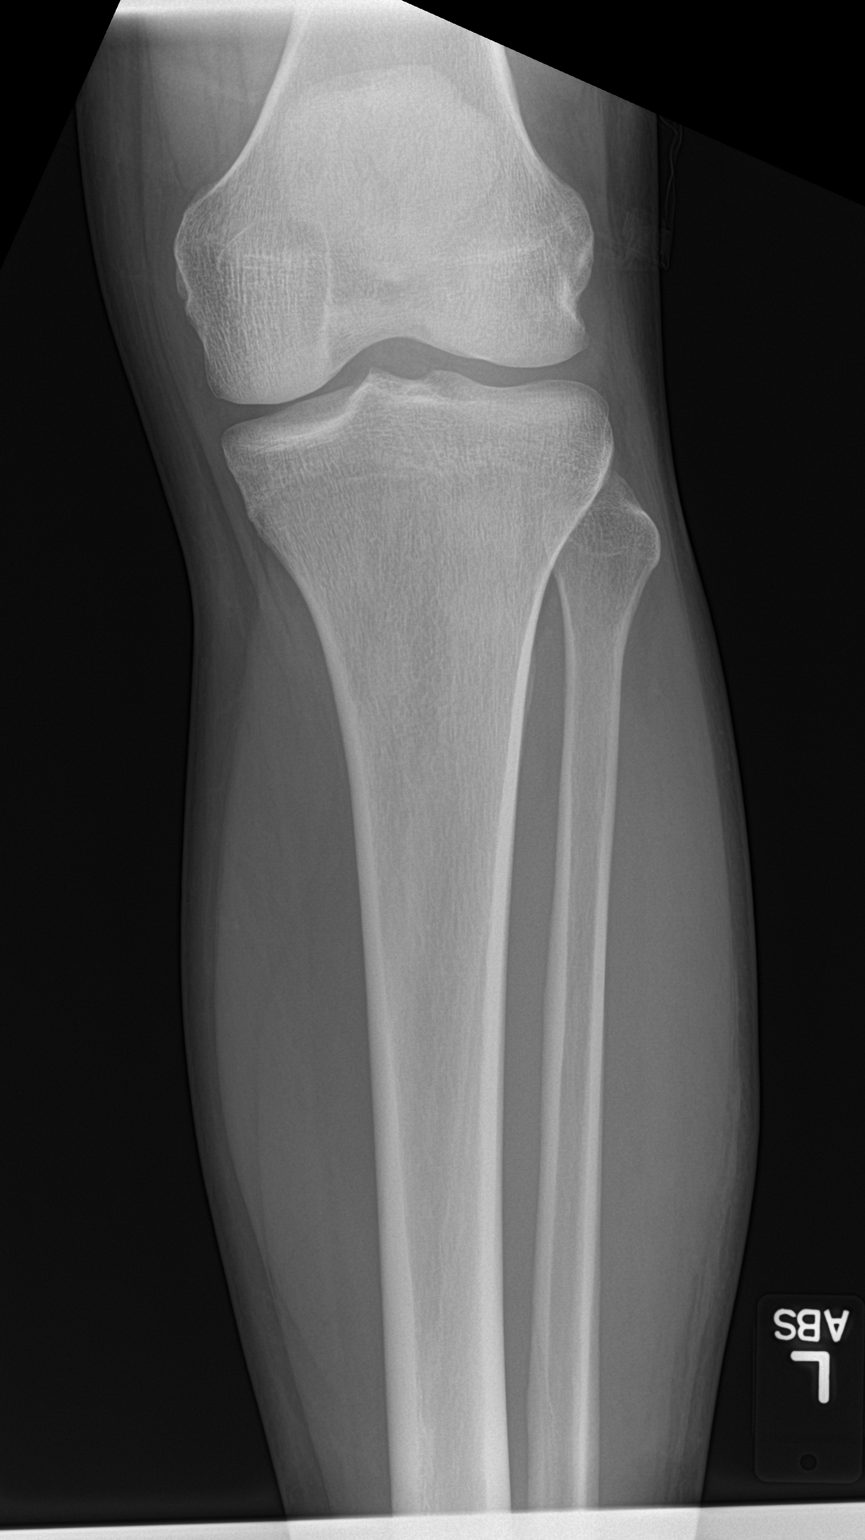
[im 3/4]
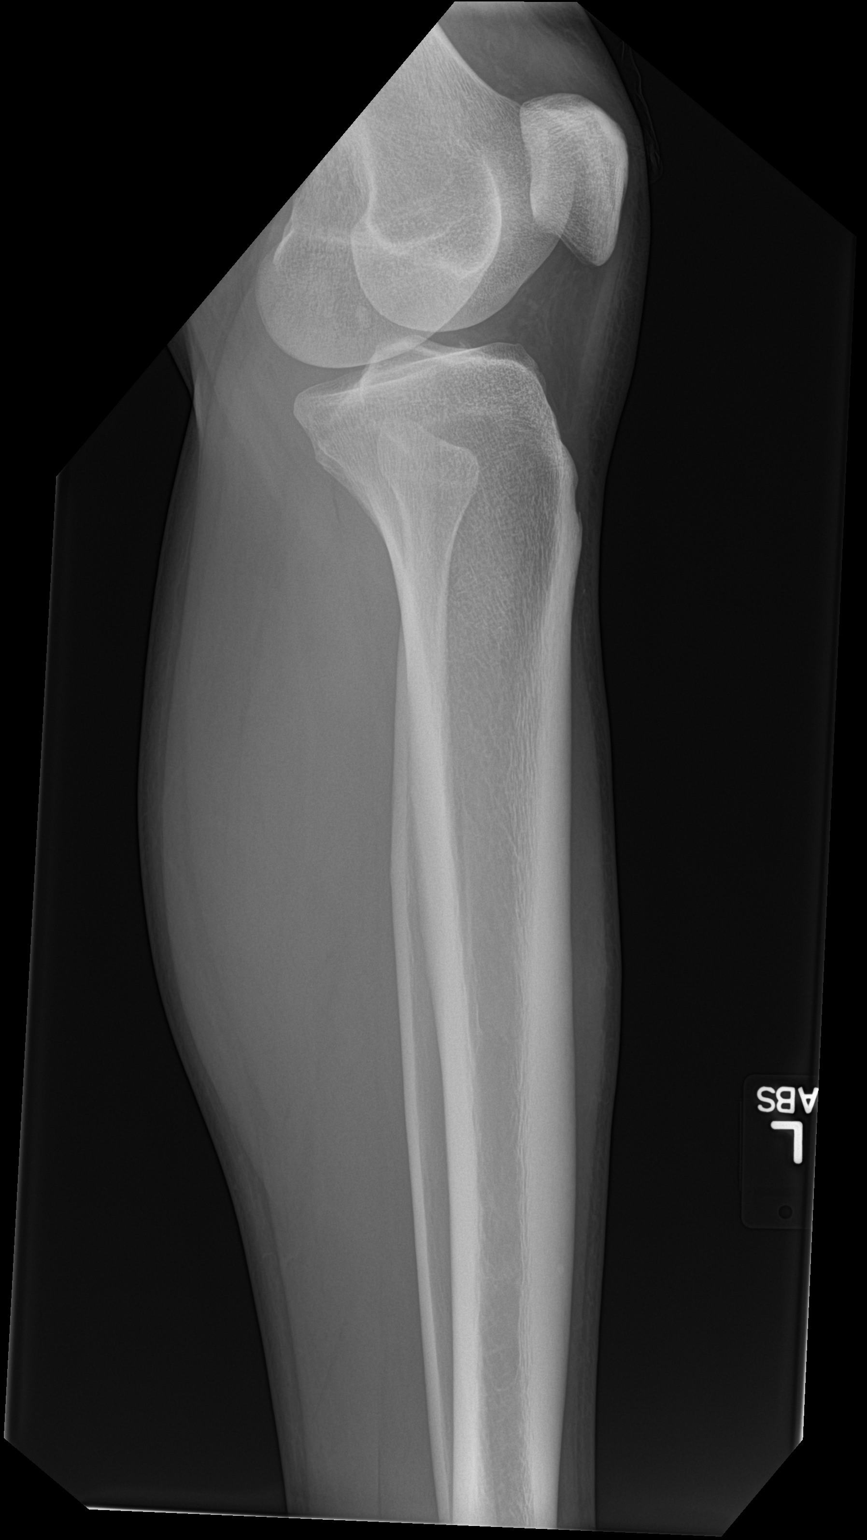
[im 4/4]
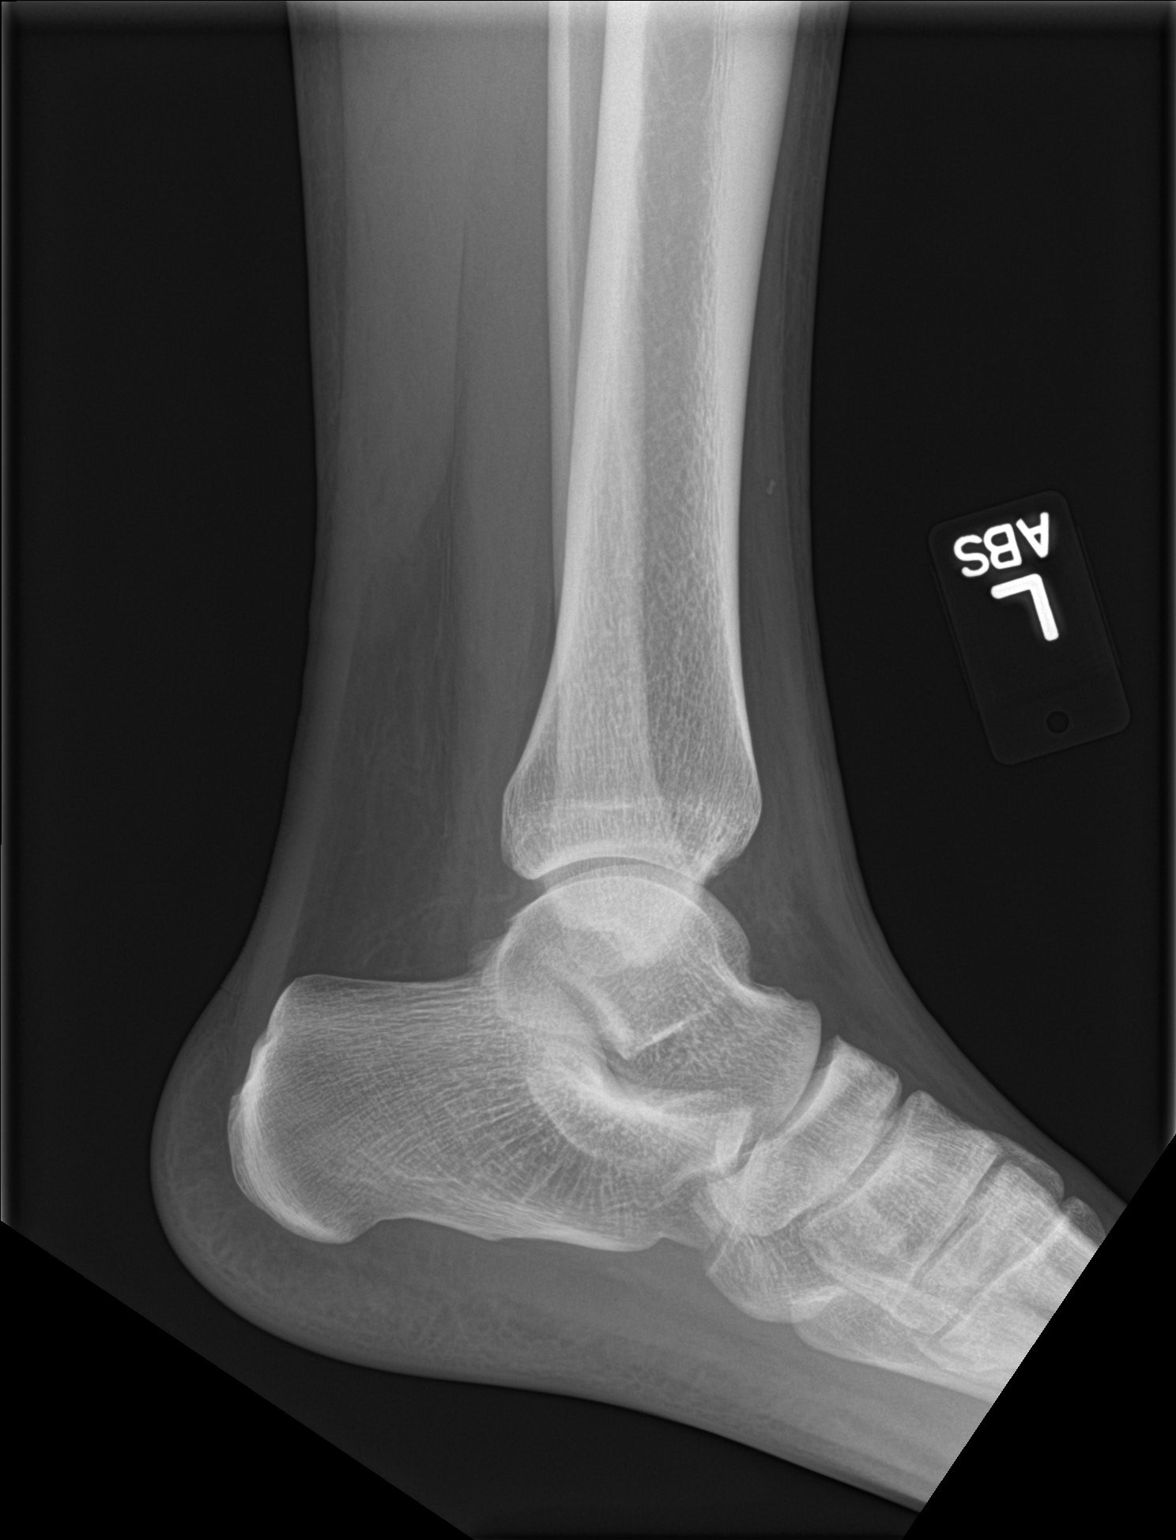

[4 of 4 positions shown; findings below may reference images not displayed]

FINDINGS: No acute bony or joint abnormality identified. No evidence of
fracture. No focal abnormality identified. Soft tissues are
unremarkable.
IMPRESSION: No acute or focal abnormality identified.

## 2020-09-18 ENCOUNTER — Other Ambulatory Visit: Payer: Self-pay

## 2020-09-18 ENCOUNTER — Encounter (HOSPITAL_COMMUNITY): Payer: Self-pay

## 2020-09-18 ENCOUNTER — Emergency Department (HOSPITAL_COMMUNITY)
Admission: EM | Admit: 2020-09-18 | Discharge: 2020-09-18 | Disposition: A | Discharge status: Home / Self Care | Payer: Self-pay | Attending: Emergency Medicine | Admitting: Emergency Medicine

## 2020-09-18 DIAGNOSIS — L02419 Cutaneous abscess of limb, unspecified: Secondary | ICD-10-CM

## 2020-09-18 DIAGNOSIS — F17210 Nicotine dependence, cigarettes, uncomplicated: Secondary | ICD-10-CM | POA: Insufficient documentation

## 2020-09-18 DIAGNOSIS — L02411 Cutaneous abscess of right axilla: Secondary | ICD-10-CM | POA: Insufficient documentation

## 2020-09-18 MED ORDER — DOXYCYCLINE HYCLATE 100 MG PO CAPS: 100.0000 mg | ORAL_CAPSULE | Freq: Two times a day (BID) | ORAL | 0 refills | Status: AC

## 2020-09-18 MED ORDER — LIDOCAINE-EPINEPHRINE (PF) 2 %-1:200000 IJ SOLN
20.0000 mL | Freq: Once | INTRAMUSCULAR | Status: AC
  Administered 2020-09-18: 20 mL
  Filled 2020-09-18: qty 20

## 2020-09-18 MED ORDER — ACETAMINOPHEN 325 MG PO TABS
650.0000 mg | ORAL_TABLET | Freq: Once | ORAL | Status: AC
  Administered 2020-09-18: 650 mg via ORAL
  Filled 2020-09-18: qty 2

## 2020-09-18 NOTE — ED Triage Notes (Signed)
Patient complains of right axilla abscess x 1 week. No drainage, no hx of same. Patient in NAD

## 2020-09-18 NOTE — ED Provider Notes (Signed)
Emergency Medicine Provider Triage Evaluation Note  Sean Clay , a 30 y.o. male  was evaluated in triage.  Pt complains of an abscess over the right axillary region that began 4 days ago.  Been constant and worsening.  Denies fever and chills.  Has not had any symptoms like this in the past.  Patient does smoke.  Review of Systems  Positive:  Negative: See above   Physical Exam  There were no vitals taken for this visit. Gen:   Awake, no distress   Resp:  Normal effort  MSK:   Moves extremities without difficulty  Other:  Area of induration and fluctuance over the right axillary region with associated erythema  Medical Decision Making  Medically screening exam initiated at 3:21 PM.  Appropriate orders placed.  Sean Clay was informed that the remainder of the evaluation will be completed by another provider, this initial triage assessment does not replace that evaluation, and the importance of remaining in the ED until their evaluation is complete.     Teressa Lower, PA-C 09/18/20 1522    Franne Forts, DO 09/20/20 1952

## 2020-09-18 NOTE — ED Notes (Signed)
Pt ambulatory to waiting room. Pt verbalized understanding of discharge instructions.   

## 2020-09-18 NOTE — Discharge Instructions (Addendum)
Contact a health care provider if you have:  More redness, swelling, or pain around your abscess.  More fluid or blood coming from your abscess.  Warm skin around your abscess.  More pus or a bad smell coming from your abscess.  A fever.  Muscle aches.  Chills or a general ill feeling.  Get help right away if you:  Have severe pain.  See red streaks on your skin spreading away from the abscess.

## 2020-09-18 NOTE — ED Provider Notes (Signed)
[facility] EMERGENCY DEPARTMENT Provider Note   CSN: [id number] Arrival date & time: 09/18/20  1437     History No chief complaint on file.   Sean Clay is a 30 y.o. male   The history is provided by the patient and a significant other. No language interpreter was used.  Abscess Location:  Torso Torso abscess location:  R axilla Size:  6 cm Abscess quality: painful, redness and warmth   Red streaking: no   Duration:  1 week Progression:  Worsening Pain details:    Quality:  Pressure and throbbing   Severity:  Moderate   Duration:  1 week   Timing:  Constant   Progression:  Worsening Chronicity:  New Context: not diabetes, not immunosuppression, not injected drug use, not insect bite/sting and not skin injury   Relieved by:  Nothing Worsened by:  Draining/squeezing Ineffective treatments:  Warm compresses and draining/squeezing Associated symptoms: no anorexia, no fatigue, no fever, no headaches, no nausea and no vomiting   Risk factors: no family hx of MRSA, no hx of MRSA and no prior abscess       History reviewed. No pertinent past medical history.  There are no problems to display for this patient.   History reviewed. No pertinent surgical history.     No family history on file.  Social History   Tobacco Use   Smoking status: Every Day    Packs/day: 0.50    Types: Cigarettes   Smokeless tobacco: Never  Vaping Use   Vaping Use: Never used  Substance Use Topics   Alcohol use: Yes   Drug use: Yes    Types: Marijuana    Home Medications Prior to Admission medications   Medication Sig Start Date End Date Taking? Authorizing Provider  naproxen (NAPROSYN) 500 MG tablet Take 1 tablet (500 mg total) by mouth 2 (two) times daily with a meal. 05/22/19   Joni Reining, PA-C    Allergies    Patient has no known allergies.  Review of Systems   Review of Systems  Constitutional:  Negative for fatigue and fever.  Gastrointestinal:   Negative for anorexia, nausea and vomiting.  Neurological:  Negative for headaches.   Physical Exam Updated Vital Signs There were no vitals taken for this visit.  Physical Exam Vitals and nursing note reviewed.  Constitutional:      General: He is not in acute distress.    Appearance: He is well-developed. He is not diaphoretic.  HENT:     Head: Normocephalic and atraumatic.  Eyes:     General: No scleral icterus.    Conjunctiva/sclera: Conjunctivae normal.  Cardiovascular:     Rate and Rhythm: Normal rate and regular rhythm.     Heart sounds: Normal heart sounds.  Pulmonary:     Effort: Pulmonary effort is normal. No respiratory distress.     Breath sounds: Normal breath sounds.  Abdominal:     Palpations: Abdomen is soft.     Tenderness: There is no abdominal tenderness.  Musculoskeletal:     Cervical back: Normal range of motion and neck supple.  Skin:    General: Skin is warm and dry.     Comments: 6 cm area of swelling with tenderness to palpation and erythema  Neurological:     Mental Status: He is alert.  Psychiatric:        Behavior: Behavior normal.    ED Results / Procedures / Treatments   Labs (all labs ordered are  listed, but only abnormal results are displayed) Labs Reviewed - No data to display  EKG None  Radiology No results found.  Procedures .Marland KitchenIncision and Drainage  Date/Time: 09/18/2020 6:58 PM Performed by: Arthor Captain, PA-C Authorized by: Arthor Captain, PA-C   Consent:    Consent obtained:  Verbal   Consent given by:  Patient   Risks discussed:  Bleeding, incomplete drainage, pain and infection   Alternatives discussed:  No treatment Universal protocol:    Patient identity confirmed:  Verbally with patient Location:    Type:  Abscess   Size:  6 cm   Location:  Trunk   Trunk location: R axilla. Pre-procedure details:    Skin preparation:  Povidone-iodine Sedation:    Sedation type:  None Anesthesia:    Anesthesia method:   Local infiltration   Local anesthetic:  Lidocaine 2% WITH epi Procedure type:    Complexity:  Simple Procedure details:    Incision types:  Single straight   Incision depth:  Dermal   Wound management:  Probed and deloculated and irrigated with saline   Drainage:  Bloody   Drainage amount:  Moderate   Wound treatment:  Wound left open   Packing materials:  None Post-procedure details:    Procedure completion:  Tolerated well, no immediate complications   Medications Ordered in ED Medications  acetaminophen (TYLENOL) tablet 650 mg (650 mg Oral Given 09/18/20 1525)  lidocaine-EPINEPHrine (XYLOCAINE W/EPI) 2 %-1:200000 (PF) injection 20 mL (20 mLs Infiltration Given by Other 09/18/20 1842)    ED Course  I have reviewed the triage vital signs and the nursing notes.  Pertinent labs & imaging results that were available during my care of the patient were reviewed by me and considered in my medical decision making (see chart for details).    MDM Rules/Calculators/A&P                           Sean Clay presents with abscess. There is no area of retained pus after procedure. The presentation of Sean Clay is NOT consistent with necrotizing fascitis or osteomyolitis. There is no evidence of retained foreign body, neurovascular or tendon injury. The presentation of Sean Clay is NOT consistent with sepsis and/or bacteremia. [name] meets outpatient criteria for treatment and is sent home on empiric antibiotics covering the relevant bacteria.  Strict return and follow-up precautions have been given by me personally or by detailed written instructions verbalized by nursing staff using the teach back method to the patient/family/caregiver(s).  Data Reviewed/Counseling: I have reviewed the patient's vital signs, nursing notes, and other relevant tests/information. I had a detailed discussion regarding the historical points, exam findings, and any diagnostic results supporting the  discharge diagnosis. I also discussed the need for outpatient follow-up and the need to return to the ED if symptoms worsen or if there are any questions or concerns that arise at home.  Final Clinical Impression(s) / ED Diagnoses Final diagnoses:  None    Rx / DC Orders ED Discharge Orders     None        Arthor Captain, PA-C 09/18/20 1900    Kommor, Jolley, MD 09/18/20 2330

## 2021-07-31 ENCOUNTER — Encounter: Payer: Self-pay | Admitting: Emergency Medicine

## 2021-07-31 ENCOUNTER — Emergency Department: Payer: Self-pay

## 2021-07-31 DIAGNOSIS — N200 Calculus of kidney: Secondary | ICD-10-CM | POA: Insufficient documentation

## 2021-07-31 LAB — URINALYSIS, ROUTINE W REFLEX MICROSCOPIC
Bacteria, UA: NONE SEEN
Bilirubin Urine: NEGATIVE
Glucose, UA: NEGATIVE mg/dL
Ketones, ur: NEGATIVE mg/dL
Nitrite: NEGATIVE
Protein, ur: NEGATIVE mg/dL
Specific Gravity, Urine: 1.019 (ref 1.005–1.030)
pH: 6 (ref 5.0–8.0)

## 2021-07-31 LAB — CBC
HCT: 44.5 % (ref 39.0–52.0)
Hemoglobin: 14.6 g/dL (ref 13.0–17.0)
MCH: 29.1 pg (ref 26.0–34.0)
MCHC: 32.8 g/dL (ref 30.0–36.0)
MCV: 88.6 fL (ref 80.0–100.0)
Platelets: 161 10*3/uL (ref 150–400)
RBC: 5.02 MIL/uL (ref 4.22–5.81)
RDW: 14.6 % (ref 11.5–15.5)
WBC: 10.9 10*3/uL — ABNORMAL HIGH (ref 4.0–10.5)
nRBC: 0 % (ref 0.0–0.2)

## 2021-07-31 LAB — COMPREHENSIVE METABOLIC PANEL
ALT: 17 U/L (ref 0–44)
AST: 15 U/L (ref 15–41)
Albumin: 4 g/dL (ref 3.5–5.0)
Alkaline Phosphatase: 62 U/L (ref 38–126)
Anion gap: 7 (ref 5–15)
BUN: 20 mg/dL (ref 6–20)
CO2: 24 mmol/L (ref 22–32)
Calcium: 9.4 mg/dL (ref 8.9–10.3)
Chloride: 105 mmol/L (ref 98–111)
Creatinine, Ser: 1.22 mg/dL (ref 0.61–1.24)
GFR, Estimated: >60 mL/min (ref >60)
Glucose, Bld: 99 mg/dL (ref 70–99)
Potassium: 3.5 mmol/L (ref 3.5–5.1)
Sodium: 136 mmol/L (ref 135–145)
Total Bilirubin: 0.9 mg/dL (ref 0.3–1.2)
Total Protein: 7.2 g/dL (ref 6.5–8.1)

## 2021-07-31 LAB — LIPASE, BLOOD: Lipase: 25 U/L (ref 11–51)

## 2021-07-31 NOTE — ED Triage Notes (Signed)
Pt presents via POV with complaints of left side pain that started 4 days ago that improved over the weekend but has gotten worse over the last several. He notes the pain radiates from the LLQ to his left testicle. Denies CP or SOB.

## 2021-08-01 ENCOUNTER — Emergency Department
Admission: EM | Admit: 2021-08-01 | Discharge: 2021-08-01 | Disposition: A | Discharge status: Home / Self Care | Payer: Self-pay | Attending: Student in an Organized Health Care Education/Training Program | Admitting: Student in an Organized Health Care Education/Training Program

## 2021-08-01 ENCOUNTER — Emergency Department: Payer: Self-pay

## 2021-08-01 DIAGNOSIS — R109 Unspecified abdominal pain: Secondary | ICD-10-CM

## 2021-08-01 DIAGNOSIS — N200 Calculus of kidney: Secondary | ICD-10-CM

## 2021-08-01 DIAGNOSIS — R10A2 Flank pain, left side: Secondary | ICD-10-CM

## 2021-08-01 MED ORDER — ONDANSETRON 4 MG PO TBDP: 4.0000 mg | ORAL_TABLET | Freq: Three times a day (TID) | ORAL | 0 refills | Status: AC | PRN

## 2021-08-01 MED ORDER — KETOROLAC TROMETHAMINE 30 MG/ML IJ SOLN
15.0000 mg | Freq: Once | INTRAMUSCULAR | Status: AC
  Administered 2021-08-01: 15 mg via INTRAVENOUS
  Filled 2021-08-01: qty 1

## 2021-08-01 MED ORDER — OXYCODONE HCL 5 MG PO TABS: 5.0000 mg | ORAL_TABLET | Freq: Three times a day (TID) | ORAL | 0 refills | Status: AC | PRN

## 2021-08-01 MED ORDER — TAMSULOSIN HCL 0.4 MG PO CAPS: 0.4000 mg | ORAL_CAPSULE | Freq: Every day | ORAL | 0 refills | Status: AC

## 2021-08-01 NOTE — ED Provider Notes (Signed)
[facility] Provider Note    Event Date/Time   First MD Initiated Contact with Patient 08/01/21 [phone]     (approximate)   History   No chief complaint on file.   HPI  Sean Clay is a 31 y.o. male no significant past medical history presents to the ER for evaluation of sudden onset left flank pain radiating down his groin and left testicle occurred this evening was associated with nausea but no vomiting.  No fevers no dysuria.  Does have family history of kidney stones.  Denies any chest pain or shortness of breath.     Physical Exam   Triage Vital Signs: ED Triage Vitals  Enc Vitals Group     BP 07/31/21 2320 (!) 143/88     Pulse Rate 07/31/21 2320 61     Resp 07/31/21 2320 16     Temp 07/31/21 2320 98.3 F (36.8 C)     Temp src --      SpO2 07/31/21 2320 98 %     Weight 07/31/21 2323 170 lb (77.1 kg)     Height 07/31/21 2323 6' (1.829 m)     Head Circumference --      Peak Flow --      Pain Score 07/31/21 2323 8     Pain Loc --      Pain Edu? --      Excl. in GC? --     Most recent vital signs: Vitals:   07/31/21 2320 08/01/21 0113  BP: (!) 143/88 123/77  Pulse: 61 65  Resp: 16 15  Temp: 98.3 F (36.8 C)   SpO2: 98% 100%     Constitutional: Alert  Eyes: Conjunctivae are normal.  Head: Atraumatic. Nose: No congestion/rhinnorhea. Mouth/Throat: Mucous membranes are moist.   Neck: Painless ROM.  Cardiovascular:   Good peripheral circulation. Respiratory: Normal respiratory effort.  No retractions.  Gastrointestinal: Soft and nontender.  Musculoskeletal:  no deformity Neurologic:  MAE spontaneously. No gross focal neurologic deficits are appreciated.  Skin:  Skin is warm, dry and intact. No rash noted. Psychiatric: Mood and affect are normal. Speech and behavior are normal.    ED Results / Procedures / Treatments   Labs (all labs ordered are listed, but only abnormal results are displayed) Labs Reviewed  CBC - Abnormal;  Notable for the following components:      Result Value   WBC 10.9 (*)    All other components within normal limits  URINALYSIS, ROUTINE W REFLEX MICROSCOPIC - Abnormal; Notable for the following components:   Color, Urine YELLOW (*)    APPearance CLEAR (*)    Hgb urine dipstick SMALL (*)    Leukocytes,Ua SMALL (*)    All other components within normal limits  URINE CULTURE  LIPASE, BLOOD  COMPREHENSIVE METABOLIC PANEL     EKG     RADIOLOGY Please see ED Course for my review and interpretation.  I personally reviewed all radiographic images ordered to evaluate for the above acute complaints and reviewed radiology reports and findings.  These findings were personally discussed with the patient.  Please see medical record for radiology report.    PROCEDURES:  Critical Care performed:   Procedures   MEDICATIONS ORDERED IN ED: Medications  ketorolac (TORADOL) 30 MG/ML injection 15 mg (15 mg Intravenous Given 08/01/21 0112)     IMPRESSION / MDM / ASSESSMENT AND PLAN / ED COURSE  I reviewed the triage vital signs and the nursing notes.  Differential diagnosis includes, but is not limited to, kidney stone, pyelo-, torsion, colitis, musculoskeletal strain, shingles  She presented to the ER for evaluation of acute left leg pain as described above.  Borderline leukocytosis likely secondary to pain.  No fever.  Urine with no bacteria.  Ultrasound and CT ordered for above differential.  CT imaging on my review and interpretation does show evidence of left-sided kidney stone.  No sign of torsion.  Patient's presentation is consistent with kidney stone.  Pain resolved after IV Toradol.  Not consistent with pyelonephritis or cystitis.  Does appear stable and appropriate for outpatient follow-up.  Patient agreeable to plan      FINAL CLINICAL IMPRESSION(S) / ED DIAGNOSES   Final diagnoses:  Kidney stone  Left flank pain     Rx / DC Orders   ED  Discharge Orders          Ordered    oxyCODONE (ROXICODONE) 5 MG immediate release tablet  Every 8 hours PRN        08/01/21 0216    ondansetron (ZOFRAN-ODT) 4 MG disintegrating tablet  Every 8 hours PRN        08/01/21 0216    tamsulosin (FLOMAX) 0.4 MG CAPS capsule  Daily after supper        08/01/21 0216             Note:  This document was prepared using Dragon voice recognition software and may include unintentional dictation errors.    Willy Eddy, MD 08/01/21 [phone]

## 2021-08-01 NOTE — ED Notes (Signed)
Patient and mother verbalized discharge understanding

## 2021-08-02 LAB — URINE CULTURE: Culture: NO GROWTH

## 2021-12-16 ENCOUNTER — Emergency Department: Payer: Commercial Managed Care - HMO

## 2021-12-16 ENCOUNTER — Other Ambulatory Visit: Payer: Self-pay

## 2021-12-16 ENCOUNTER — Emergency Department
Admission: EM | Admit: 2021-12-16 | Discharge: 2021-12-16 | Disposition: A | Discharge status: Home / Self Care | Payer: Commercial Managed Care - HMO | Attending: Emergency Medicine | Admitting: Emergency Medicine

## 2021-12-16 DIAGNOSIS — S8991XA Unspecified injury of right lower leg, initial encounter: Secondary | ICD-10-CM

## 2021-12-16 DIAGNOSIS — M25561 Pain in right knee: Secondary | ICD-10-CM | POA: Insufficient documentation

## 2021-12-16 MED ORDER — IBUPROFEN 600 MG PO TABS
600.0000 mg | ORAL_TABLET | Freq: Once | ORAL | Status: AC
  Administered 2021-12-16: 600 mg via ORAL
  Filled 2021-12-16: qty 1

## 2021-12-16 MED ORDER — ACETAMINOPHEN 500 MG PO TABS
1000.0000 mg | ORAL_TABLET | Freq: Once | ORAL | Status: AC
Start: 2021-12-16 — End: 2021-12-16
  Administered 2021-12-16: 1000 mg via ORAL
  Filled 2021-12-16: qty 2

## 2021-12-16 NOTE — ED Provider Notes (Signed)
[facility] Provider Note    Event Date/Time   First MD Initiated Contact with Patient 12/16/21 1622     (approximate)   History   Knee Pain   HPI  Sean Clay is a 31 y.o. male   Past medical history of remote right ACL and meniscal injury nonoperative greater than 8 years ago who presents to the emergency department with a reinjury of the right knee.  He was carrying boxes 2 days ago when he twisted the knee and had medial pain since then.  Has been able to ambulate and go to work despite the pain.  No other injuries.  History was obtained via the patient.      Physical Exam   Triage Vital Signs: ED Triage Vitals  Enc Vitals Group     BP 12/16/21 1455 123/73     Pulse Rate 12/16/21 1455 73     Resp 12/16/21 1455 18     Temp 12/16/21 1455 97.9 F (36.6 C)     Temp Source 12/16/21 1455 Oral     SpO2 12/16/21 1455 97 %     Weight 12/16/21 1456 175 lb (79.4 kg)     Height 12/16/21 1456 6' (1.829 m)     Head Circumference --      Peak Flow --      Pain Score 12/16/21 1503 7     Pain Loc --      Pain Edu? --      Excl. in GC? --     Most recent vital signs: Vitals:   12/16/21 1455  BP: 123/73  Pulse: 73  Resp: 18  Temp: 97.9 F (36.6 C)  SpO2: 97%    General: Awake, no distress.  CV:  Good peripheral perfusion.  Resp:  Normal effort.  Abd:  No distention.  Other:  Comfortable well-appearing with some tenderness to the medial side of the right knee.  No laxity to varus or valgus stress and posterior and anterior stressing as well.  Neurovascular intact.   ED Results / Procedures / Treatments   Labs (all labs ordered are listed, but only abnormal results are displayed) Labs Reviewed - No data to display   RADIOLOGY I independently reviewed and interpreted xr knee and see no obvious fracture or dislocation   PROCEDURES:  Critical Care performed: No  Procedures   MEDICATIONS ORDERED IN ED: Medications  ibuprofen  (ADVIL) tablet 600 mg (has no administration in time range)  acetaminophen (TYLENOL) tablet 1,000 mg (has no administration in time range)    IMPRESSION / MDM / ASSESSMENT AND PLAN / ED COURSE  I reviewed the triage vital signs and the nursing notes.                              Differential diagnosis includes, but is not limited to, right knee fracture dislocation, ligamentous injury unlikely septic joint   MDM: Knee injury and has been weightbearing for the last 2 days will obtain an x-ray for bony abnormalities.  If negative, will provide knee immobilizer and crutches and gives conservative management and referral to orthopedics for further evaluation and management of pain does not get better in a few days.  Patient's presentation is most consistent with acute presentation with potential threat to life or bodily function.       FINAL CLINICAL IMPRESSION(S) / ED DIAGNOSES   Final diagnoses:  Right knee injury, initial encounter  Rx / DC Orders   ED Discharge Orders     None        Note:  This document was prepared using Dragon voice recognition software and may include unintentional dictation errors.    Pilar Jarvis, MD 12/16/21 [phone]

## 2021-12-16 NOTE — Discharge Instructions (Addendum)
Take acetaminophen 650 mg and ibuprofen 400 mg every 6 hours for pain.  Take with food. Use the knee immobilizer and crutches as needed if pain is severe with walking and weightbearing.  You may use your Ace bandage to wrap to help with pain and swelling.  Use ice and elevate the affected knee to help with pain and swelling as well.  You continue to have pain at midweek next week call Dr. Allena Katz for follow-up appointment with orthopedics for further evaluation and management of your knee pain.  Thank you for choosing [place] for your health care today!  Please see your primary doctor this week for a follow up appointment.   Sometimes, in the early stages of certain disease courses it is difficult to detect in the emergency department evaluation -- so, it is important that you continue to monitor your symptoms and call your doctor right away or return to the emergency department if you develop any new or worsening symptoms.  Please go to the following website to schedule new (and existing) patient appointments:   [email or web address]  If you do not have a primary doctor try calling the following clinics to establish care:  If you have insurance:  [facility] [phone] [address] [place] [place] [address]   [facility]  [phone] [address] [place] [place] [address]   If you do not have insurance:  [facility]  [phone] [address] [place] [address]   The following is another list of primary care offices in the area who are accepting new patients at this time.  Please reach out to one of them directly and let them know you would like to schedule an appointment to follow up on an Emergency Department visit, and/or to establish a new primary care provider (PCP).  There are likely other primary care clinics in the are who are accepting new patients, but this is an excellent place to  start:  [facility] Lead physician: Dr Shirlee Latch [address] [place], [place] [address] [phone]  [facility] Lead Physician: Dr Alba Cory [address], [place], [place] [address] [phone]  [facility]  Lead Physician: Dr Olevia Perches [address] [place], [place] [address] [phone]  [facility] Lead Physician: Dr Sofie Hartigan [address] [place], [place], [place] [address] [phone]  [facility] at [facility] Lead Physician: Dr Bari Edward [address] [place], [place] [address] [phone]   It was my pleasure to care for you today.   Daneil Dan Modesto Charon, MD

## 2021-12-16 NOTE — ED Triage Notes (Signed)
Pt states he was seen a couple days ago for knee injury and was given crutches and jelly for his knee that helped, pt is requesting the same gel and knee wrap to support knee. Right is swollen compared to the left knee. Pt has pain with weight bearing.

## 2022-04-26 DIAGNOSIS — H5789 Other specified disorders of eye and adnexa: Secondary | ICD-10-CM | POA: Diagnosis not present

## 2022-04-26 DIAGNOSIS — B9689 Other specified bacterial agents as the cause of diseases classified elsewhere: Secondary | ICD-10-CM | POA: Diagnosis not present

## 2022-04-26 DIAGNOSIS — H1033 Unspecified acute conjunctivitis, bilateral: Secondary | ICD-10-CM | POA: Insufficient documentation

## 2022-04-27 ENCOUNTER — Encounter: Payer: Self-pay | Admitting: Emergency Medicine

## 2022-04-27 ENCOUNTER — Emergency Department
Admission: EM | Admit: 2022-04-27 | Discharge: 2022-04-27 | Disposition: A | Discharge status: Home / Self Care | Payer: 59 | Attending: Emergency Medicine | Admitting: Emergency Medicine

## 2022-04-27 DIAGNOSIS — H109 Unspecified conjunctivitis: Secondary | ICD-10-CM

## 2022-04-27 MED ORDER — FLUORESCEIN SODIUM 1 MG OP STRP
1.0000 | ORAL_STRIP | Freq: Once | OPHTHALMIC | Status: DC
  Filled 2022-04-27: qty 1

## 2022-04-27 MED ORDER — TETRACAINE HCL 0.5 % OP SOLN
1.0000 [drp] | Freq: Once | OPHTHALMIC | Status: DC
  Filled 2022-04-27: qty 4

## 2022-04-27 MED ORDER — POLYMYXIN B-TRIMETHOPRIM 10000-0.1 UNIT/ML-% OP SOLN: 1.0000 [drp] | Freq: Four times a day (QID) | OPHTHALMIC | 0 refills | Status: AC

## 2022-04-27 NOTE — Discharge Instructions (Signed)
Use eyedrops as prescribed.  If your symptoms do not improve in the next 4 to 5 days, call [facility] for a follow-up appointment.

## 2022-04-27 NOTE — ED Triage Notes (Addendum)
Pt presents ambulatory to triage via POV with complaints of bilateral eye drainage and redness that started 4 days ago. 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 He endorses eye sensitivity when touching his eyes due to the drainage. Pt has some crusting to the inner corners of his eyes. A&Ox4 at this time. Denies fevers, vision changes, dizziness.

## 2022-04-27 NOTE — ED Provider Notes (Addendum)
[facility] Provider Note    Event Date/Time   First MD Initiated Contact with Patient 04/27/22 0131     (approximate)   History   Eye Drainage   HPI  Sean Clay is a 32 y.o. male   Past medical history of no significant past medical history who presents with bilateral eye grittiness sensation and purulent discharge for the last 1 day.  Symptom onset about a week ago with watery itchy eyes he thought were related to allergies and has been rubbing his eyes frequently.  This morning he woke with a grittiness sensation and his eyes were glued shut.  He has no visual changes and no systemic symptoms like fevers or chills.  He does work in a workplace with dust and he wonders if there is dust in his eyes as well.  Independent Historian contributed to assessment above: His girlfriend at bedside corroborates information given above     Physical Exam   Triage Vital Signs: ED Triage Vitals  Enc Vitals Group     BP 04/27/22 0001 (!) 144/88     Pulse Rate 04/27/22 0001 82     Resp 04/27/22 0001 18     Temp 04/27/22 0001 98.5 F (36.9 C)     Temp src --      SpO2 04/27/22 0001 99 %     Weight 04/27/22 0002 175 lb (79.4 kg)     Height 04/27/22 0002 6' (1.829 m)     Head Circumference --      Peak Flow --      Pain Score --      Pain Loc --      Pain Edu? --      Excl. in GC? --     Most recent vital signs: Vitals:   04/27/22 0001  BP: (!) 144/88  Pulse: 82  Resp: 18  Temp: 98.5 F (36.9 C)  SpO2: 99%    General: Awake, no distress.  CV:  Good peripheral perfusion.  Resp:  Normal effort.  Abd:  No distention.  Other:  He does have bilateral conjunctival injection and fluorescein Wood's lamp exam shows no corneal abrasions or ulcerations.  There is some mild purulent discharge w nl visual acuity   ED Results / Procedures / Treatments   Labs (all labs ordered are listed, but only abnormal results are displayed) Labs Reviewed - No data to  display    PROCEDURES:  Critical Care performed: No  Procedures   MEDICATIONS ORDERED IN ED: Medications  tetracaine (PONTOCAINE) 0.5 % ophthalmic solution 1 drop (has no administration in time range)  fluorescein ophthalmic strip 1 strip (has no administration in time range)    IMPRESSION / MDM / ASSESSMENT AND PLAN / ED COURSE  I reviewed the triage vital signs and the nursing notes.                                Patient's presentation is most consistent with acute complicated illness / injury requiring diagnostic workup.  Differential diagnosis includes, but is not limited to, corneal abrasion or ulceration, bacterial conjunctivitis, allergic conjunctivitis   The patient is on the cardiac monitor to evaluate for evidence of arrhythmia and/or significant heart rate changes.  MDM: Patient with evidence of bacterial conjunctivitis with purulent discharge injected conjunctiva and will prescribe antibiotic eyedrops and have him follow-up with [facility] if symptoms are not much improved  after several days of treatment.  There is no signs of corneal abrasions or ulcerations which I checked for given his exposure to dust in his workplace environment.  I advised him to wear goggles at work.  Since he has no significant pain, visual changes, I doubt more emergent eye pathologies at this time.       FINAL CLINICAL IMPRESSION(S) / ED DIAGNOSES   Final diagnoses:  Conjunctivitis, bacterial     Rx / DC Orders   ED Discharge Orders          Ordered    trimethoprim-polymyxin b (POLYTRIM) ophthalmic solution  4 times daily        04/27/22 0211             Note:  This document was prepared using Dragon voice recognition software and may include unintentional dictation errors.    Pilar Jarvis, MD 04/27/22 [place] Lathe    Pilar Jarvis, MD 04/27/22 Earle Gell

## 2022-04-27 NOTE — ED Notes (Signed)
Visual Acuity 20/20 bilaterally.

## 2022-04-27 NOTE — ED Notes (Signed)
Patient verbalizes understanding of discharge instructions. Opportunity for questioning and answers were provided. Armband removed by staff, pt discharged from ED. Ambulated out to lobby
